# Patient Record
Sex: Female | Born: 1955 | Race: White | Hispanic: No | Marital: Married | Smoking: Former smoker
Health system: Southern US, Community
[De-identification: ages and names within clinical notes are randomized; demographics above are authoritative.]

## PROBLEM LIST (undated history)

## (undated) DIAGNOSIS — T8859XA Other complications of anesthesia, initial encounter: Secondary | ICD-10-CM

## (undated) DIAGNOSIS — L309 Dermatitis, unspecified: Secondary | ICD-10-CM

## (undated) DIAGNOSIS — T4145XA Adverse effect of unspecified anesthetic, initial encounter: Secondary | ICD-10-CM

## (undated) DIAGNOSIS — M1990 Unspecified osteoarthritis, unspecified site: Secondary | ICD-10-CM

## (undated) DIAGNOSIS — F4310 Post-traumatic stress disorder, unspecified: Secondary | ICD-10-CM

## (undated) HISTORY — DX: Post-traumatic stress disorder, unspecified: F43.10

## (undated) HISTORY — DX: Dermatitis, unspecified: L30.9

## (undated) HISTORY — PX: COLONOSCOPY: SHX174

---

## 1977-02-04 HISTORY — PX: PILONIDAL CYST EXCISION: SHX744

## 1998-01-13 ENCOUNTER — Other Ambulatory Visit: Admission: RE | Admit: 1998-01-13 | Discharge: 1998-01-13 | Payer: Self-pay | Admitting: Obstetrics and Gynecology

## 1999-11-16 ENCOUNTER — Encounter: Payer: Self-pay | Admitting: Obstetrics and Gynecology

## 1999-11-16 ENCOUNTER — Encounter: Admission: RE | Admit: 1999-11-16 | Discharge: 1999-11-16 | Payer: Self-pay | Admitting: Obstetrics and Gynecology

## 2001-05-28 ENCOUNTER — Encounter: Payer: Self-pay | Admitting: Obstetrics and Gynecology

## 2001-05-28 ENCOUNTER — Encounter: Admission: RE | Admit: 2001-05-28 | Discharge: 2001-05-28 | Payer: Self-pay | Admitting: Obstetrics and Gynecology

## 2002-02-04 HISTORY — PX: CYST EXCISION: SHX5701

## 2002-05-31 ENCOUNTER — Encounter: Admission: RE | Admit: 2002-05-31 | Discharge: 2002-05-31 | Payer: Self-pay | Admitting: Obstetrics and Gynecology

## 2002-05-31 ENCOUNTER — Encounter: Payer: Self-pay | Admitting: Obstetrics and Gynecology

## 2004-04-16 ENCOUNTER — Ambulatory Visit: Payer: Self-pay | Admitting: Internal Medicine

## 2004-04-23 ENCOUNTER — Ambulatory Visit: Payer: Self-pay | Admitting: Internal Medicine

## 2006-08-04 ENCOUNTER — Ambulatory Visit: Payer: Self-pay | Admitting: Internal Medicine

## 2006-08-04 LAB — CONVERTED CEMR LAB
ALT: 52 units/L — ABNORMAL HIGH (ref 0–35)
AST: 42 units/L — ABNORMAL HIGH (ref 0–37)
Albumin: 3.9 g/dL (ref 3.5–5.2)
Alkaline Phosphatase: 51 units/L (ref 39–117)
BUN: 10 mg/dL (ref 6–23)
Basophils Absolute: 0 10*3/uL (ref 0.0–0.1)
Basophils Relative: 0.5 % (ref 0.0–1.0)
Bilirubin, Direct: 0.1 mg/dL (ref 0.0–0.3)
CO2: 31 meq/L (ref 19–32)
Calcium: 9.7 mg/dL (ref 8.4–10.5)
Chloride: 107 meq/L (ref 96–112)
Cholesterol: 221 mg/dL (ref 0–200)
Creatinine, Ser: 0.9 mg/dL (ref 0.4–1.2)
Direct LDL: 94 mg/dL
Eosinophils Absolute: 0.1 10*3/uL (ref 0.0–0.6)
Eosinophils Relative: 2.6 % (ref 0.0–5.0)
GFR calc Af Amer: 85 mL/min
GFR calc non Af Amer: 70 mL/min
Glucose, Bld: 82 mg/dL (ref 70–99)
HCT: 37.3 % (ref 36.0–46.0)
HDL: 98.4 mg/dL (ref >39.0)
Hemoglobin: 12.9 g/dL (ref 12.0–15.0)
Lymphocytes Relative: 44.1 % (ref 12.0–46.0)
MCHC: 34.7 g/dL (ref 30.0–36.0)
MCV: 97.3 fL (ref 78.0–100.0)
Monocytes Absolute: 0.4 10*3/uL (ref 0.2–0.7)
Monocytes Relative: 11.4 % — ABNORMAL HIGH (ref 3.0–11.0)
Neutro Abs: 1.5 10*3/uL (ref 1.4–7.7)
Neutrophils Relative %: 41.4 % — ABNORMAL LOW (ref 43.0–77.0)
Platelets: 315 10*3/uL (ref 150–400)
Potassium: 4.6 meq/L (ref 3.5–5.1)
RBC: 3.83 M/uL — ABNORMAL LOW (ref 3.87–5.11)
RDW: 12.2 % (ref 11.5–14.6)
Sodium: 142 meq/L (ref 135–145)
TSH: 1.46 microintl units/mL (ref 0.35–5.50)
Total Bilirubin: 0.9 mg/dL (ref 0.3–1.2)
Total CHOL/HDL Ratio: 2.2
Total Protein: 6.8 g/dL (ref 6.0–8.3)
Triglycerides: 62 mg/dL (ref 0–149)
VLDL: 12 mg/dL (ref 0–40)
WBC: 3.7 10*3/uL — ABNORMAL LOW (ref 4.5–10.5)

## 2006-08-20 ENCOUNTER — Ambulatory Visit: Payer: Self-pay | Admitting: Internal Medicine

## 2006-08-20 ENCOUNTER — Encounter: Payer: Self-pay | Admitting: Internal Medicine

## 2006-08-20 DIAGNOSIS — F411 Generalized anxiety disorder: Secondary | ICD-10-CM | POA: Insufficient documentation

## 2006-08-20 DIAGNOSIS — D72818 Other decreased white blood cell count: Secondary | ICD-10-CM | POA: Insufficient documentation

## 2006-08-20 DIAGNOSIS — R945 Abnormal results of liver function studies: Secondary | ICD-10-CM | POA: Insufficient documentation

## 2006-09-29 ENCOUNTER — Ambulatory Visit: Payer: Self-pay | Admitting: Internal Medicine

## 2006-09-29 LAB — CONVERTED CEMR LAB
ANA Titer 1: 1:160 {titer} — ABNORMAL HIGH
Anti Nuclear Antibody(ANA): POSITIVE — AB
HCV Ab: NEGATIVE
Hep B Core Total Ab: NEGATIVE
Hep B S Ab: NEGATIVE
Hepatitis B Surface Ag: NEGATIVE

## 2006-10-07 LAB — CONVERTED CEMR LAB
ALT: 44 units/L — ABNORMAL HIGH (ref 0–35)
AST: 43 units/L — ABNORMAL HIGH (ref 0–37)
Albumin: 4.4 g/dL (ref 3.5–5.2)
Alkaline Phosphatase: 64 units/L (ref 39–117)
Basophils Absolute: 0 10*3/uL (ref 0.0–0.1)
Basophils Relative: 0.1 % (ref 0.0–1.0)
Bilirubin, Direct: 0.2 mg/dL (ref 0.0–0.3)
Eosinophils Absolute: 0.1 10*3/uL (ref 0.0–0.6)
Eosinophils Relative: 1.5 % (ref 0.0–5.0)
Ferritin: 44.5 ng/mL (ref 10.0–291.0)
HCT: 39 % (ref 36.0–46.0)
Hemoglobin: 13.7 g/dL (ref 12.0–15.0)
Lymphocytes Relative: 36.9 % (ref 12.0–46.0)
MCHC: 35.2 g/dL (ref 30.0–36.0)
MCV: 96 fL (ref 78.0–100.0)
Monocytes Absolute: 0.5 10*3/uL (ref 0.2–0.7)
Monocytes Relative: 10.7 % (ref 3.0–11.0)
Neutro Abs: 2.2 10*3/uL (ref 1.4–7.7)
Neutrophils Relative %: 50.8 % (ref 43.0–77.0)
Platelets: 353 10*3/uL (ref 150–400)
RBC: 4.06 M/uL (ref 3.87–5.11)
RDW: 12.3 % (ref 11.5–14.6)
Sed Rate: 15 mm/hr (ref 0–25)
Total Bilirubin: 1.1 mg/dL (ref 0.3–1.2)
Total Protein: 7.1 g/dL (ref 6.0–8.3)
Vitamin B-12: 864 pg/mL (ref 211–911)
WBC: 4.5 10*3/uL (ref 4.5–10.5)

## 2007-02-09 ENCOUNTER — Ambulatory Visit: Payer: Self-pay | Admitting: Internal Medicine

## 2007-02-09 DIAGNOSIS — R740 Nonspecific elevation of levels of transaminase and lactic acid dehydrogenase [LDH]: Secondary | ICD-10-CM

## 2007-02-09 DIAGNOSIS — R7401 Elevation of levels of liver transaminase levels: Secondary | ICD-10-CM | POA: Insufficient documentation

## 2007-02-09 LAB — CONVERTED CEMR LAB
ALT: 29 units/L (ref 0–35)
AST: 34 units/L (ref 0–37)
Albumin: 4 g/dL (ref 3.5–5.2)
Alkaline Phosphatase: 65 units/L (ref 39–117)
Bilirubin, Direct: 0.1 mg/dL (ref 0.0–0.3)
Total Bilirubin: 0.7 mg/dL (ref 0.3–1.2)
Total Protein: 6.8 g/dL (ref 6.0–8.3)

## 2007-02-16 ENCOUNTER — Ambulatory Visit: Payer: Self-pay | Admitting: Internal Medicine

## 2007-02-16 DIAGNOSIS — R799 Abnormal finding of blood chemistry, unspecified: Secondary | ICD-10-CM | POA: Insufficient documentation

## 2007-07-07 ENCOUNTER — Ambulatory Visit: Payer: Self-pay | Admitting: Internal Medicine

## 2007-07-14 ENCOUNTER — Ambulatory Visit: Payer: Self-pay | Admitting: Internal Medicine

## 2007-08-24 ENCOUNTER — Ambulatory Visit: Payer: Self-pay | Admitting: Internal Medicine

## 2007-08-24 LAB — CONVERTED CEMR LAB
ALT: 29 units/L (ref 0–35)
AST: 33 units/L (ref 0–37)
Albumin: 4.1 g/dL (ref 3.5–5.2)
Alkaline Phosphatase: 59 units/L (ref 39–117)
BUN: 11 mg/dL (ref 6–23)
Basophils Absolute: 0 10*3/uL (ref 0.0–0.1)
Basophils Relative: 0.9 % (ref 0.0–3.0)
Bilirubin Urine: NEGATIVE
Bilirubin, Direct: 0.1 mg/dL (ref 0.0–0.3)
Blood in Urine, dipstick: NEGATIVE
CO2: 30 meq/L (ref 19–32)
Calcium: 9.6 mg/dL (ref 8.4–10.5)
Chloride: 109 meq/L (ref 96–112)
Cholesterol: 213 mg/dL (ref 0–200)
Creatinine, Ser: 1.1 mg/dL (ref 0.4–1.2)
Direct LDL: 96.9 mg/dL
Eosinophils Absolute: 0.2 10*3/uL (ref 0.0–0.7)
Eosinophils Relative: 4.5 % (ref 0.0–5.0)
GFR calc Af Amer: 67 mL/min
GFR calc non Af Amer: 55 mL/min
Glucose, Bld: 83 mg/dL (ref 70–99)
Glucose, Urine, Semiquant: NEGATIVE
HCT: 36.4 % (ref 36.0–46.0)
HDL: 94.3 mg/dL (ref >39.0)
Hemoglobin: 12.9 g/dL (ref 12.0–15.0)
Ketones, urine, test strip: NEGATIVE
Lymphocytes Relative: 50.7 % — ABNORMAL HIGH (ref 12.0–46.0)
MCHC: 35.5 g/dL (ref 30.0–36.0)
MCV: 97.3 fL (ref 78.0–100.0)
Monocytes Absolute: 0.4 10*3/uL (ref 0.1–1.0)
Monocytes Relative: 8.9 % (ref 3.0–12.0)
Neutro Abs: 1.5 10*3/uL (ref 1.4–7.7)
Neutrophils Relative %: 35 % — ABNORMAL LOW (ref 43.0–77.0)
Nitrite: NEGATIVE
Platelets: 298 10*3/uL (ref 150–400)
Potassium: 4.6 meq/L (ref 3.5–5.1)
Protein, U semiquant: NEGATIVE
RBC: 3.75 M/uL — ABNORMAL LOW (ref 3.87–5.11)
RDW: 12.7 % (ref 11.5–14.6)
Sodium: 145 meq/L (ref 135–145)
Specific Gravity, Urine: 1.02
TSH: 1.96 microintl units/mL (ref 0.35–5.50)
Total Bilirubin: 1.1 mg/dL (ref 0.3–1.2)
Total CHOL/HDL Ratio: 2.3
Total Protein: 6.9 g/dL (ref 6.0–8.3)
Triglycerides: 59 mg/dL (ref 0–149)
Urobilinogen, UA: 0.2
VLDL: 12 mg/dL (ref 0–40)
WBC Urine, dipstick: NEGATIVE
WBC: 4.3 10*3/uL — ABNORMAL LOW (ref 4.5–10.5)
pH: 5.5

## 2007-08-31 ENCOUNTER — Ambulatory Visit: Payer: Self-pay | Admitting: Internal Medicine

## 2007-08-31 DIAGNOSIS — N951 Menopausal and female climacteric states: Secondary | ICD-10-CM | POA: Insufficient documentation

## 2007-09-28 ENCOUNTER — Encounter: Payer: Self-pay | Admitting: Internal Medicine

## 2007-09-28 ENCOUNTER — Ambulatory Visit: Payer: Self-pay | Admitting: Internal Medicine

## 2008-05-17 ENCOUNTER — Telehealth: Payer: Self-pay | Admitting: Internal Medicine

## 2008-05-18 ENCOUNTER — Ambulatory Visit: Payer: Self-pay | Admitting: Internal Medicine

## 2008-05-18 DIAGNOSIS — J309 Allergic rhinitis, unspecified: Secondary | ICD-10-CM | POA: Insufficient documentation

## 2008-05-18 DIAGNOSIS — J069 Acute upper respiratory infection, unspecified: Secondary | ICD-10-CM | POA: Insufficient documentation

## 2008-05-19 ENCOUNTER — Ambulatory Visit: Payer: Self-pay | Admitting: Internal Medicine

## 2008-05-19 DIAGNOSIS — J029 Acute pharyngitis, unspecified: Secondary | ICD-10-CM | POA: Insufficient documentation

## 2008-05-19 LAB — CONVERTED CEMR LAB: Rapid Strep: NEGATIVE

## 2010-06-22 NOTE — Assessment & Plan Note (Signed)
[facility]                                 ON-CALL NOTE   BECKA, LAGASSE                          MRN:          [id number]  DATE:07/04/2008                            DOB:          14-Jul-1955    The patient of Dr. Fabian Sharp.   The patient calls in with runny nose, nasal congestion, and cough with  slight sore throat for 2 days.  The patient requests azithromycin.  I  have discussed her symptoms with her, which found to be typical upper  respiratory infection symptoms and we discussed symptomatic care  including DayQuil, Mucinex DM or similar medication during the daytime  along with nasal decongestant.  If DayQuil does not work, which includes  phenylephrine, she could try pseudoephedrine.  Also, recommended getting  some Afrin nasal spray over the next 3-4 days.  She can also take NyQuil  at nighttime.  If she has continued symptoms lasting 10-14 days and then  further medical evaluation is warranted, but I reassured her on at the  telephone that this likely is upper respiratory viral infection without  probable bacterial infection.  She does not have a history of pulmonary  disease, asthma, or emphysema.     Juleen China, MD    STC/MedQ  DD: 07/04/2008  DT: 07/04/2008  Job #: [id number]   cc:   Neta Mends. Fabian Sharp, MD

## 2013-09-24 ENCOUNTER — Encounter: Payer: Self-pay | Admitting: Internal Medicine

## 2015-01-04 ENCOUNTER — Emergency Department (HOSPITAL_BASED_OUTPATIENT_CLINIC_OR_DEPARTMENT_OTHER)
Admission: EM | Admit: 2015-01-04 | Discharge: 2015-01-04 | Disposition: A | Discharge status: Home / Self Care | Payer: PRIVATE HEALTH INSURANCE | Attending: Emergency Medicine | Admitting: Emergency Medicine

## 2015-01-04 ENCOUNTER — Emergency Department (HOSPITAL_BASED_OUTPATIENT_CLINIC_OR_DEPARTMENT_OTHER): Payer: PRIVATE HEALTH INSURANCE

## 2015-01-04 ENCOUNTER — Encounter (HOSPITAL_BASED_OUTPATIENT_CLINIC_OR_DEPARTMENT_OTHER): Payer: Self-pay

## 2015-01-04 DIAGNOSIS — J9801 Acute bronchospasm: Secondary | ICD-10-CM

## 2015-01-04 DIAGNOSIS — Z87891 Personal history of nicotine dependence: Secondary | ICD-10-CM | POA: Diagnosis not present

## 2015-01-04 DIAGNOSIS — R05 Cough: Secondary | ICD-10-CM

## 2015-01-04 DIAGNOSIS — R059 Cough, unspecified: Secondary | ICD-10-CM

## 2015-01-04 DIAGNOSIS — R0982 Postnasal drip: Secondary | ICD-10-CM

## 2015-01-04 DIAGNOSIS — J219 Acute bronchiolitis, unspecified: Secondary | ICD-10-CM | POA: Diagnosis not present

## 2015-01-04 MED ORDER — ALBUTEROL SULFATE HFA 108 (90 BASE) MCG/ACT IN AERS: 2.0000 | INHALATION_SPRAY | RESPIRATORY_TRACT | Status: DC | PRN

## 2015-01-04 MED ORDER — ALBUTEROL SULFATE HFA 108 (90 BASE) MCG/ACT IN AERS
2.0000 | INHALATION_SPRAY | RESPIRATORY_TRACT | Status: DC | PRN
  Administered 2015-01-04: 2 via RESPIRATORY_TRACT
  Filled 2015-01-04: qty 6.7

## 2015-01-04 MED ORDER — AEROCHAMBER PLUS FLO-VU MEDIUM MISC
1.0000 | Freq: Once | Status: AC
  Administered 2015-01-04: 1
  Filled 2015-01-04: qty 1

## 2015-01-04 NOTE — Discharge Instructions (Signed)
1. Medications: albuterol, zyrtec, usual home medications 2. Treatment: rest, drink plenty of fluids, take tylenol or ibuprofen for fever control 3. Follow Up: Please followup with your primary doctor in 2 weeks for discussion of your diagnoses and further evaluation after today's visit; if you do not have a primary care doctor use the resource guide provided to find one; Return to the ER for high fevers, difficulty breathing or other concerning symptoms
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
## 2015-01-04 NOTE — ED Notes (Signed)
C/o prod cough x 4 weeks-[facility]-completed abx- "over a week ago"

## 2016-11-01 ENCOUNTER — Ambulatory Visit (INDEPENDENT_AMBULATORY_CARE_PROVIDER_SITE_OTHER): Payer: No Typology Code available for payment source | Admitting: Family Medicine

## 2016-11-01 ENCOUNTER — Encounter: Payer: Self-pay | Admitting: Family Medicine

## 2016-11-01 VITALS — BP 90/70 | HR 64 | Temp 97.9°F | Ht 63.0 in | Wt 134.3 lb

## 2016-11-01 DIAGNOSIS — Z7689 Persons encountering health services in other specified circumstances: Secondary | ICD-10-CM | POA: Diagnosis not present

## 2016-11-01 DIAGNOSIS — Z23 Encounter for immunization: Secondary | ICD-10-CM | POA: Diagnosis not present

## 2016-11-01 DIAGNOSIS — F32A Depression, unspecified: Secondary | ICD-10-CM

## 2016-11-01 DIAGNOSIS — M25551 Pain in right hip: Secondary | ICD-10-CM | POA: Diagnosis not present

## 2016-11-01 DIAGNOSIS — F419 Anxiety disorder, unspecified: Secondary | ICD-10-CM | POA: Diagnosis not present

## 2016-11-01 DIAGNOSIS — F329 Major depressive disorder, single episode, unspecified: Secondary | ICD-10-CM | POA: Diagnosis not present

## 2016-11-01 NOTE — Progress Notes (Signed)
HPI:  Ashley Williams is here to establish care. Was seeing a primary care doctor in [facility], but her insurance was not accepted there. Her husband sees Dr. Sarajane Jews and saw me once and recommended me to her. She is fairly healthy overall. She has a past medical history of anxiety and depression and used to be on medications for this. She currently treats this with a mood supplement that contains amino acids and magnesium and is doing much better. She has some chronic right lateral hip pain that she thinks was caused by a fall remotely in 2002. She was running and tripped on a lemon and fell on her right knee and left shoulder. The pain is worsened by long days of being on her feet. There are many days when she has no pain at all, and she has no symptoms today. She is interested in osteopathic treatments for this.it sounds like she had a physical yearly with her prior PCP and had a physical last year. She is due for mammogram and is going to check with her insurance about where she should have this done. ROS negative for unless reported above: fevers, unintentional weight loss, hearing or vision loss, chest pain, palpitations, struggling to breath, hemoptysis, melena, hematochezia, hematuria, falls, loc, si, thoughts of self harm  No past medical history on file.  Past Surgical History:  Procedure Laterality Date  . CYST EXCISION     groin area  . PILONIDAL CYST EXCISION  1979    Family History  Problem Relation Age of Onset  . Cancer Mother   . Colon cancer Mother   . Uterine cancer Mother   . Cancer Father   . Cancer Maternal Grandmother   . Emphysema Maternal Grandfather   . Cancer Paternal Grandfather     Social History   Social History  . Marital status: Married    Spouse name: N/A  . Number of children: N/A  . Years of education: N/A   Social History Main Topics  . Smoking status: Former Research scientist (life sciences)  . Smokeless tobacco: Never Used  . Alcohol use Yes     Comment: occ  . Drug  use: No  . Sexual activity: Yes    Birth control/ protection: Post-menopausal   Other Topics Concern  . None   Social History Narrative   Work or Geographical information systems officer, does Dentist, private practice      Home Situation:lives with husband      Spiritual Beliefs:Christian      Lifestyle:yoga and weight lifting on a regular basis, healthy diet        Current Outpatient Prescriptions:  .  B Complex Vitamins (VITAMIN B COMPLEX PO), Take by mouth., Disp: , Rfl:  .  Flaxseed, Linseed, (FLAX SEED OIL PO), Take by mouth., Disp: , Rfl:  .  Multiple Vitamins-Minerals (MULTIVITAMIN ADULTS PO), Take by mouth., Disp: , Rfl:  .  Omega-3 Fatty Acids (FISH OIL PO), Take by mouth., Disp: , Rfl:  .  OVER THE COUNTER MEDICATION, Essential mood supplement-hormones,etc, Disp: , Rfl:   EXAM:  Vitals:   11/01/16 1114  BP: 90/70  Pulse: 64  Temp: 97.9 F (36.6 C)    Body mass index is 23.79 kg/m.  GENERAL: vitals reviewed and listed above, alert, oriented, appears well hydrated and in no acute distress  HEENT: atraumatic, conjunttiva clear, no obvious abnormalities on inspection of external nose and ears  NECK: no obvious masses on inspection  LUNGS: clear to auscultation bilaterally, no wheezes,  rales or rhonchi, good air movement  CV: HRRR, no peripheral edema  MS: moves all extremities without noticeable abnormality, normal gait, no tenderness to palpation on exam today, normal functioning of the lower extremities bilaterally  PSYCH: pleasant and cooperative, no obvious depression or anxiety  ASSESSMENT AND PLAN:  Discussed the following assessment and plan:  Need for immunization against influenza - Plan: Flu Vaccine QUAD 6+ mos PF IM (Fluarix Quad PF)  Anxiety and depression  Right hip pain - -Tripped on a lemon remotely and landed on right and left shoulder-Intermittent lateral right hip pain worsened by being on her feet a lot  Encounter to establish care -We  reviewed the PMH, PSH, FH, SH, Meds and Allergies. 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  Patient Instructions  BEFORE YOU LEAVE: -flu shot -follow up: 1) OMT session for R hip pain 2) CPE at her convenience   It was very nice to meet you today!     Colin Benton R.

## 2016-11-01 NOTE — Patient Instructions (Signed)
BEFORE YOU LEAVE: -flu shot -follow up: 1) OMT session for R hip pain 2) CPE at her convenience   It was very nice to meet you today!

## 2016-11-21 ENCOUNTER — Encounter: Payer: Self-pay | Admitting: Family Medicine

## 2016-11-21 ENCOUNTER — Ambulatory Visit (INDEPENDENT_AMBULATORY_CARE_PROVIDER_SITE_OTHER): Payer: No Typology Code available for payment source | Admitting: Family Medicine

## 2016-11-21 VITALS — BP 120/80 | HR 67 | Temp 98.0°F | Ht 63.0 in | Wt 136.5 lb

## 2016-11-21 DIAGNOSIS — M25512 Pain in left shoulder: Secondary | ICD-10-CM

## 2016-11-21 DIAGNOSIS — M9901 Segmental and somatic dysfunction of cervical region: Secondary | ICD-10-CM | POA: Diagnosis not present

## 2016-11-21 DIAGNOSIS — M25551 Pain in right hip: Secondary | ICD-10-CM

## 2016-11-21 DIAGNOSIS — M9905 Segmental and somatic dysfunction of pelvic region: Secondary | ICD-10-CM

## 2016-11-21 DIAGNOSIS — G8929 Other chronic pain: Secondary | ICD-10-CM

## 2016-11-21 DIAGNOSIS — M9902 Segmental and somatic dysfunction of thoracic region: Secondary | ICD-10-CM | POA: Diagnosis not present

## 2016-11-21 DIAGNOSIS — M9903 Segmental and somatic dysfunction of lumbar region: Secondary | ICD-10-CM

## 2016-11-21 DIAGNOSIS — M9909 Segmental and somatic dysfunction of abdomen and other regions: Secondary | ICD-10-CM | POA: Diagnosis not present

## 2016-11-21 DIAGNOSIS — M9906 Segmental and somatic dysfunction of lower extremity: Secondary | ICD-10-CM

## 2016-11-21 NOTE — Patient Instructions (Signed)
BEFORE YOU LEAVE: -follow up: 2 weeks omt -xray sheet

## 2016-11-21 NOTE — Progress Notes (Signed)
HPI:  KORRI ASK is a pleasant 61 year old here for osteopathic treatments for chronic right lateral hip pain. She reported at her last visit"She has some chronic right lateral hip pain that she thinks was caused by a fall remotely in 2002. She was running and tripped on a lemon and fell on her right knee and left shoulder. The pain is worsened by long days of being on her feet." She gest pretty severe pain in the R groin at times. The shoulder pain is in the shoulder blade region and the shoulder joint. Sleeping on L worsens shoulder pain. Biofreeze and massage therapy help her pain.  ROS: See pertinent positives and negatives per HPI.  No past medical history on file.  Past Surgical History:  Procedure Laterality Date  . CYST EXCISION     groin area  . PILONIDAL CYST EXCISION  1979    Family History  Problem Relation Age of Onset  . Cancer Mother   . Colon cancer Mother   . Uterine cancer Mother   . Cancer Father   . Cancer Maternal Grandmother   . Emphysema Maternal Grandfather   . Cancer Paternal Grandfather     Social History   Social History  . Marital status: Married    Spouse name: N/A  . Number of children: N/A  . Years of education: N/A   Social History Main Topics  . Smoking status: Former Research scientist (life sciences)  . Smokeless tobacco: Never Used  . Alcohol use Yes     Comment: occ  . Drug use: No  . Sexual activity: Yes    Birth control/ protection: Post-menopausal   Other Topics Concern  . None   Social History Narrative   Work or Geographical information systems officer, does Dentist, private practice      Home Situation:lives with husband      Spiritual Beliefs:Christian      Lifestyle:yoga and weight lifting on a regular basis, healthy diet        Current Outpatient Prescriptions:  .  B Complex Vitamins (VITAMIN B COMPLEX PO), Take by mouth., Disp: , Rfl:  .  Flaxseed, Linseed, (FLAX SEED OIL PO), Take by mouth., Disp: , Rfl:  .  Multiple Vitamins-Minerals  (MULTIVITAMIN ADULTS PO), Take by mouth., Disp: , Rfl:  .  Omega-3 Fatty Acids (FISH OIL PO), Take by mouth., Disp: , Rfl:  .  OVER THE COUNTER MEDICATION, Essential mood supplement-hormones,etc, Disp: , Rfl:   EXAM:  Vitals:   11/21/16 1649  BP: 120/80  Pulse: 67  Temp: 98 F (36.7 C)    Body mass index is 24.18 kg/m.  GENERAL: vitals reviewed and listed above, alert, oriented, appears well hydrated and in no acute distress  HEENT: atraumatic, conjunttiva clear, no obvious abnormalities on inspection of external nose and ears  NECK: no obvious masses on inspection  LUNGS: clear to auscultation bilaterally, no wheezes, rales or rhonchi, good air movement  CV: HRRR, no peripheral edema  MS: moves all extremities without noticeable abnormality, on standing inspection she has pes planus of the R foot, head forward 1-2 cm and L shoulder elevated, OA rotated L, T 2-8 rotated L,L shoulder restricted in abd,  L 2-5 R r, L leg shorter, L ant innominate, R tib fib restricted in ext rotation, R hip with significant restriction and hard endpoint in ext rotation and abd, neg spurling, neg vertebral art insuff testing,   PSYCH: pleasant and cooperative, no obvious depression or anxiety  ASSESSMENT AND PLAN:  Discussed the  following assessment and plan:  Right hip pain - Plan: DG HIP UNILAT WITH PELVIS MIN 4 VIEWS RIGHT  Chronic left shoulder pain  Somatic dysfunction of cervical region  Somatic dysfunction of thoracic region  Somatic dysfunction of lumbar region  Somatic dysfunction of pelvis region  Somatic dysfuncton of other region  Somatic dysfunction of lower extremity  -will get plain films R hip - suspect bony pathology - sig OA? -she opted for osteopathic treatments today and we did very gentle techniques - see below -Patient advised to return or notify a doctor immediately if symptoms worsen or persist or new concerns arise. PROCEDURE NOTE : OSTEOPATHIC  TREATMENT The decision today to treat with gentle Osteopathic Manipulative Therapy  (OMT) was based on physical exam findings. Verbal consent was  obtained after after explanation of risks and benefits. No Cervical HVLA manipulation was performed. After consent was obtained, treatment was  performed as below:      Regions treated: neck, thoracic, lumbar, shoulder, pelvic, lower ext     Techniques used: ME, MR, counterstrain The patient tolerated the treatment well and reported Improved  symptoms following treatment today. Follow up treatment was advised in: 1-2 weeks   Patient Instructions  BEFORE YOU LEAVE: -follow up: 2 weeks omt -xray sheet    Colin Benton R., DO

## 2016-11-22 ENCOUNTER — Other Ambulatory Visit: Payer: Self-pay | Admitting: Family Medicine

## 2016-11-22 DIAGNOSIS — Z1231 Encounter for screening mammogram for malignant neoplasm of breast: Secondary | ICD-10-CM

## 2016-11-27 ENCOUNTER — Ambulatory Visit (INDEPENDENT_AMBULATORY_CARE_PROVIDER_SITE_OTHER)
Admission: RE | Admit: 2016-11-27 | Discharge: 2016-11-27 | Disposition: A | Discharge status: Home / Self Care | Payer: No Typology Code available for payment source | Source: Ambulatory Visit | Attending: Family Medicine | Admitting: Family Medicine

## 2016-11-27 ENCOUNTER — Other Ambulatory Visit: Payer: Self-pay | Admitting: Family Medicine

## 2016-11-27 DIAGNOSIS — M25551 Pain in right hip: Secondary | ICD-10-CM

## 2016-11-28 ENCOUNTER — Other Ambulatory Visit: Payer: Self-pay | Admitting: *Deleted

## 2016-11-28 DIAGNOSIS — M8700 Idiopathic aseptic necrosis of unspecified bone: Secondary | ICD-10-CM

## 2016-11-28 DIAGNOSIS — M160 Bilateral primary osteoarthritis of hip: Secondary | ICD-10-CM

## 2016-12-02 ENCOUNTER — Telehealth: Payer: Self-pay | Admitting: Family Medicine

## 2016-12-02 NOTE — Telephone Encounter (Signed)
Patient called back and I informed her of the message below. 

## 2016-12-02 NOTE — Telephone Encounter (Signed)
Pt states Dr Wynelle Link cannot see her until Jan.  Pt wants to know if ok to see dr Alvan Dame sooner. She had first wanted to see Dr Wynelle Link, but wants to know if you think urgent enough to proceed with Dr Alvan Dame sooner?  Also, pt wants to know if ok for he to exercise with weight machine and yoga? Pt states she read if no blood flow to the hip , it could shatter.  Please advise

## 2016-12-02 NOTE — Telephone Encounter (Signed)
I would advise she see another provider if they have a sooner appt and would advise avoiding strenuous activities involving the hip or difficult yoga positions that involve the hips - especially pigeon pose and those types of poses until advised by ortho. Thanks.

## 2016-12-02 NOTE — Telephone Encounter (Signed)
I left a message for the pt to return my call. 

## 2016-12-04 ENCOUNTER — Ambulatory Visit (INDEPENDENT_AMBULATORY_CARE_PROVIDER_SITE_OTHER): Payer: No Typology Code available for payment source

## 2016-12-04 DIAGNOSIS — Z1231 Encounter for screening mammogram for malignant neoplasm of breast: Secondary | ICD-10-CM | POA: Diagnosis not present

## 2016-12-08 NOTE — Progress Notes (Signed)
HPI:  Follow up multiple musculoskeletal pain complaints. She wishes to treat with OMT. At her initial OMT visit 11/27/16 she had decreased ROM hips with hard endpoint and described chronic often severe hip pain R>L. We didn't treat these areas and I got plain films due to concerns for significant OA - she had severe OA and possible avascular necrosis and I placed a referral to Ortho. She also had pain between the shoulder blades and the shoulders. Sig exam findings other then mentioned above included pes planus R, head forward posture, L shoulder elevated, somatic dysfunction of the cervical, thoracic and lumbar spine, L ant innominate, R tib-fib int rotated. Today reports: doing well. Wants to do further osteopathic treatments. Is set up to see ortho in 3 days regarding her hips. Pain today remains in hips per prior. Some L shoulder restriction/tightness in abduction. Does a lot of yoga. Wants my thoughts on the hip findings. Also wants copy films to take to ortho. Denies: weakness, numbness, fevers, malaise. She also requests information regarding CBT for mild depression. Wants to try our therapist. No severe symptoms and does nor feel needs medications.  ROS: See pertinent positives and negatives per HPI.  History reviewed. No pertinent past medical history.  Past Surgical History:  Procedure Laterality Date  . CYST EXCISION     groin area  . PILONIDAL CYST EXCISION  1979    Family History  Problem Relation Age of Onset  . Cancer Mother   . Colon cancer Mother   . Uterine cancer Mother   . Cancer Father   . Cancer Maternal Grandmother   . Emphysema Maternal Grandfather   . Cancer Paternal Grandfather     Social History   Socioeconomic History  . Marital status: Married    Spouse name: None  . Number of children: None  . Years of education: None  . Highest education level: None  Social Needs  . Financial resource strain: None  . Food insecurity - worry: None  . Food  insecurity - inability: None  . Transportation needs - medical: None  . Transportation needs - non-medical: None  Occupational History  . None  Tobacco Use  . Smoking status: Former Research scientist (life sciences)  . Smokeless tobacco: Never Used  Substance and Sexual Activity  . Alcohol use: Yes    Comment: occ  . Drug use: No  . Sexual activity: Yes    Birth control/protection: Post-menopausal  Other Topics Concern  . None  Social History Narrative   Work or Geographical information systems officer, does Engineer, manufacturing counseling, private practice      Home Situation:lives with husband      Spiritual Beliefs:Christian      Lifestyle:yoga and weight lifting on a regular basis, healthy diet     Current Outpatient Medications:  .  B Complex Vitamins (VITAMIN B COMPLEX PO), Take by mouth., Disp: , Rfl:  .  Flaxseed, Linseed, (FLAX SEED OIL PO), Take by mouth., Disp: , Rfl:  .  Glucosamine-Chondroitin (OSTEO BI-FLEX REGULAR STRENGTH PO), Take daily by mouth., Disp: , Rfl:  .  Multiple Vitamins-Minerals (MULTIVITAMIN ADULTS PO), Take by mouth., Disp: , Rfl:  .  Omega-3 Fatty Acids (FISH OIL PO), Take by mouth., Disp: , Rfl:  .  OVER THE COUNTER MEDICATION, Essential mood supplement-hormones,etc, Disp: , Rfl:   EXAM:  Vitals:   12/09/16 1307  BP: 118/68  Pulse: 67  Temp: 98.1 F (36.7 C)    Body mass index is 24.18 kg/m.  GENERAL: vitals reviewed and listed  above, alert, oriented, appears well hydrated and in no acute distress  NECK: no obvious masses on inspection  LUNGS: clear to auscultation bilaterally, no wheezes, rales or rhonchi, good air movement  CV: HRRR, no peripheral edema  JK:DTOIZT gait. We did not do further exam of hips as will hold off on osteopathic treatments here until seen by ortho. Will focus on back/shoulders/neck today: OA Rr, L SBS torsion, subocipital muscle tension, TI Rr, R 4-8 RrSl, L 3-5 RLSr, L shoulder restricted in Abd and internally rotated  PSYCH: pleasant and cooperative, no  obvious depression or anxiety  ASSESSMENT AND PLAN:  Discussed the following assessment and plan:  Pain of right hip joint  Osteoarthritis of both hips, unspecified osteoarthritis type  Chronic left shoulder pain  Somatic dysfunction of head region  Somatic dysfunction of cervical region  Somatic dysfunction of thoracic region  Somatic dysfunction of spine, lumbar  Somatic dysfunction of upper extremity  Depression, recurrent ([place])  -advised to avoid yoga until seen by ortho - particularly positions that strain the hips/pigeon/etc -copy xray report provided, will have assistant help her with obtaining CD -she wanted to do OMT for other regions, see below, responded well -follow up after ortho eval -assistant to provide CBT (therapy brochure) for her to schedule for mild depression  PROCEDURE NOTE : OSTEOPATHIC TREATMENT The decision today to treat with gentle Osteopathic Manipulative Therapy  (OMT) was based on physical exam findings. Verbal consent was  obtained after after explanation of risks and benefits. No Cervical HVLA manipulation was performed. After consent was obtained, treatment was  performed as below:      Regions treated: head, cervical, thoracic, lumbar, extremity     Techniques used: MR, ME, Cranial The patient tolerated the treatment well and reported Improved  symptoms following treatment today. Follow up treatment was advised in: 1-2 weeks   -Patient advised to return or notify a doctor immediately if symptoms worsen or persist or new concerns arise.  Patient Instructions  BEFORE YOU LEAVE: -print out her xray report for her and instruct her on how to obtain CD copy xrays -follow up: 1-2 weeks for OMT if she wishes  See the orthopedic specialist about your his as planned. Let me know how this goes.      Colin Benton R., DO

## 2016-12-09 ENCOUNTER — Ambulatory Visit: Payer: No Typology Code available for payment source | Admitting: Family Medicine

## 2016-12-09 ENCOUNTER — Encounter: Payer: Self-pay | Admitting: Family Medicine

## 2016-12-09 VITALS — BP 118/68 | HR 67 | Temp 98.1°F | Ht 63.0 in

## 2016-12-09 DIAGNOSIS — M160 Bilateral primary osteoarthritis of hip: Secondary | ICD-10-CM | POA: Diagnosis not present

## 2016-12-09 DIAGNOSIS — M9902 Segmental and somatic dysfunction of thoracic region: Secondary | ICD-10-CM

## 2016-12-09 DIAGNOSIS — M9907 Segmental and somatic dysfunction of upper extremity: Secondary | ICD-10-CM

## 2016-12-09 DIAGNOSIS — M25551 Pain in right hip: Secondary | ICD-10-CM | POA: Diagnosis not present

## 2016-12-09 DIAGNOSIS — M9900 Segmental and somatic dysfunction of head region: Secondary | ICD-10-CM | POA: Diagnosis not present

## 2016-12-09 DIAGNOSIS — G8929 Other chronic pain: Secondary | ICD-10-CM | POA: Diagnosis not present

## 2016-12-09 DIAGNOSIS — M9903 Segmental and somatic dysfunction of lumbar region: Secondary | ICD-10-CM | POA: Diagnosis not present

## 2016-12-09 DIAGNOSIS — M25512 Pain in left shoulder: Secondary | ICD-10-CM | POA: Diagnosis not present

## 2016-12-09 DIAGNOSIS — M9901 Segmental and somatic dysfunction of cervical region: Secondary | ICD-10-CM

## 2016-12-09 DIAGNOSIS — F339 Major depressive disorder, recurrent, unspecified: Secondary | ICD-10-CM | POA: Insufficient documentation

## 2016-12-09 NOTE — Patient Instructions (Signed)
BEFORE YOU LEAVE: -print out her xray report for her and instruct her on how to obtain CD copy xrays -follow up: 1-2 weeks for OMT if she wishes  See the orthopedic specialist about your his as planned. Let me know how this goes.

## 2017-02-12 ENCOUNTER — Telehealth: Payer: Self-pay | Admitting: Family Medicine

## 2017-02-12 NOTE — Telephone Encounter (Signed)
Patient dropped off surgical clearance form  Fax form to: [phone] ATTN: Orson Slick  Disposition: Dr's Folder

## 2017-02-13 NOTE — Telephone Encounter (Signed)
Can you schedule preop? Don't need to rout this to me. Just schedule and hold form for appt. Thanks.

## 2017-02-13 NOTE — Telephone Encounter (Signed)
I left a detailed message stating per Dr Maudie Mercury the pt needs to schedule an appt prior to the clearance letter being completed.  I asked that the pt call back for an appt.

## 2017-02-14 ENCOUNTER — Encounter: Payer: Self-pay | Admitting: *Deleted

## 2017-02-14 ENCOUNTER — Ambulatory Visit (INDEPENDENT_AMBULATORY_CARE_PROVIDER_SITE_OTHER): Payer: BLUE CROSS/BLUE SHIELD | Admitting: Family Medicine

## 2017-02-14 ENCOUNTER — Encounter: Payer: Self-pay | Admitting: Family Medicine

## 2017-02-14 VITALS — BP 100/70 | HR 59 | Temp 97.6°F | Ht 63.0 in | Wt 136.6 lb

## 2017-02-14 DIAGNOSIS — M25559 Pain in unspecified hip: Secondary | ICD-10-CM

## 2017-02-14 DIAGNOSIS — Z01818 Encounter for other preprocedural examination: Secondary | ICD-10-CM

## 2017-02-14 NOTE — Patient Instructions (Signed)
We will fax a summary and the form to your surgeons office.

## 2017-02-14 NOTE — Progress Notes (Signed)
No chief complaint on file.   HPI:  Patient is seen for optimization of general medical care prior to surgery. Surgery type: R hip THA w/wo autograft/allograft Date of surgery: 03/11/2017  Kidney disease? No Prior surgeries/Issues following anesthesia? Pilonidal cyst removal - had sedation, remote, no issues or complication Hx MI, heart arrythmia, CHF, angina or stroke? none Epilepsy or Seizures? none Arthritis or problems with neck or jaw? none Thyroid disease? none Liver disease? none Asthma, COPD or chronic lung disease? none Diabetes? none (Needs to be evaluated by anesthesia if yes to these questions.)  Other: Poor nutrition, Frail or other: no  METS:  ?Can take care of self, such as eat, dress, or use the toilet (1 MET). yes ?Can walk up a flight of steps or a hill (4 METs).yes ?Can do heavy work around the house such as scrubbing floors or lifting or moving heavy furniture (between 4 and 10 METs). yes ?Can participate in strenuous sports such as swimming, singles tennis, football, basketball, and skiing (>10 METs): swimming ok - can swim right now . AHA Risks: Major predictors that require intensive management and may lead to delay in or cancellation of the operative procedure unless emergent: NONE  . Unstable coronary syndromes including unstable or severe angina or recent MI  . Decompensated heart failure including NYHA functional class IV or worsening or new-onset HF  . Significant arrhythmias including high grade AV block, symptomatic ventricular arrhythmias, supraventricular arrhythmias with ventricular rate >100 bpm at rest, symptomatic bradycardia, and newly recognized ventricular tachycardia  . Severe heart valve disease including severe aortic stenosis or symptomatic mitral stenosis   Other clinical predictors that warrant careful assessment of current status: NONE  . History of ischemic heart disease . History of cerebrovascular disease  . History of compensated  heart failure or prior heart failure  . Diabetes mellitus  . Renal insufficiency  Type of surgery and Risk: 1) High risk (reported risk of cardiac death or nonfatal myocardial infarction [MI] often greater than 5 percent):  Marland Kitchen Aortic and other major vascular surgery  . Peripheral artery surgery   2)Intermediate risk (reported risk of cardiac death or nonfatal MI generally 1 to 5 percent):  Marland Kitchen Carotid endarterectomy  . Head and neck surgery  . Intraperitoneal and intrathoracic surgery  . Orthopedic surgery  . Prostate surgery   3)Low risk (reported risk of cardiac death or nonfatal MI generally less than 1 percent):  Marland Kitchen Ambulatory surgery  . Endoscopic procedures  . Superficial procedure  . Cataract surgery  . Breast surgery  Medications that need to be addressed prior to surgery: supplements Discontinue acei/arbs/non-statin lipid lowering drugs day of surgery ASA stop 7 days before or discuss with cardiology if CV risks, other anticoagulants discuss with cardiology.   ROS: See pertinent positives and negatives per HPI. 11 point ROS negative except where noted.  History reviewed. No pertinent past medical history.  Past Surgical History:  Procedure Laterality Date  . CYST EXCISION     groin area  . PILONIDAL CYST EXCISION  1979    Family History  Problem Relation Age of Onset  . Cancer Mother   . Colon cancer Mother   . Uterine cancer Mother   . Cancer Father   . Cancer Maternal Grandmother   . Emphysema Maternal Grandfather   . Cancer Paternal Grandfather     Social History   Socioeconomic History  . Marital status: Married    Spouse name: None  . Number of children: None  .  Years of education: None  . Highest education level: None  Social Needs  . Financial resource strain: None  . Food insecurity - worry: None  . Food insecurity - inability: None  . Transportation needs - medical: None  . Transportation needs - non-medical: None  Occupational History  .  None  Tobacco Use  . Smoking status: Former Research scientist (life sciences)  . Smokeless tobacco: Never Used  Substance and Sexual Activity  . Alcohol use: Yes    Comment: occ  . Drug use: No  . Sexual activity: Yes    Birth control/protection: Post-menopausal  Other Topics Concern  . None  Social History Narrative   Work or Geographical information systems officer, does Engineer, manufacturing counseling, private practice      Home Situation:lives with husband      Spiritual Beliefs:Christian      Lifestyle:yoga and weight lifting on a regular basis, healthy diet     Current Outpatient Medications:  .  B Complex Vitamins (VITAMIN B COMPLEX PO), Take by mouth., Disp: , Rfl:  .  Flaxseed, Linseed, (FLAX SEED OIL PO), Take by mouth., Disp: , Rfl:  .  Glucosamine-Chondroitin (OSTEO BI-FLEX REGULAR STRENGTH PO), Take daily by mouth., Disp: , Rfl:  .  Multiple Vitamins-Minerals (MULTIVITAMIN ADULTS PO), Take by mouth., Disp: , Rfl:  .  Omega-3 Fatty Acids (FISH OIL PO), Take by mouth., Disp: , Rfl:  .  OVER THE COUNTER MEDICATION, Essential mood supplement-hormones,etc, Disp: , Rfl:   EXAM:  Vitals:   02/14/17 1138  BP: 100/70  Pulse: (!) 59  Temp: 97.6 F (36.4 C)    Body mass index is 24.2 kg/m.  GENERAL: vitals reviewed and listed above, alert, oriented, appears well hydrated and in no acute distress  HEENT: atraumatic, conjunttiva clear, no obvious abnormalities on inspection of external nose and ears  NECK: no obvious masses on inspection, no carotid bruits  LUNGS: clear to auscultation bilaterally, no wheezes, rales or rhonchi, good air movement  CV: HRRR, no peripheral edema, no JVD, BP normal range, normal radial pulses  MS: moves all extremities without noticeable abnormality  PSYCH: pleasant and cooperative, no obvious depression or anxiety  More than 50% of over 25 minutes spent in total in caring for this patient was spent face-to-face with the patient, counseling and/or coordinating care.    ASSESSMENT AND  PLAN:  Discussed the following assessment and plan:  Pre-op examination  Arthralgia of hip, unspecified laterality  Assessment: -Risk factors: none -Surgery Risks:intermediate -age, nutritional status, fraility: good nutritional status, age <65, no fraility -functional capacity: > 4 - 6 METs wethout symptoms -comorbidities: none Patient Specific Risks: patient is low risk for intermediate risks surgery  Recommendations for optimizing general medical care prior to surgery: -would advise basic CBC and BMP shortly prior to surgery and any other testing anesthesia or surgeon feel is warranted - will defer to surgical office for this -advised patient to discuss specific risks morbidity and mortality of surgery with surgeon, CV risks discussed with patient -advised patient will defer to surgeon for post-op DVT prophylaxis and post op care -no specific medical recommendations except good nutrition, activity as tolerated and per surgeon recommendations and stop supplements 1 week prior to surgery  - at this time and no recommendations to defer surgery or for further CV testing prior to surgery -this assessment/plan and  form for pre-op optimization of general medical care prior to surgery faxed to surgeon office per patient request   Patient Instructions  We will fax a summary and  the form to your surgeons office.       Colin Benton R.

## 2017-03-02 NOTE — H&P (Signed)
TOTAL HIP ADMISSION H&P  Patient is admitted for right total hip arthroplasty, anterior appraoch.  Subjective:  Chief Complaint:     Right hip primary OA / pain  HPI: Ashley Williams, 62 y.o. female, has a history of pain and functional disability in the right hip(s) due to arthritis and patient has failed non-surgical conservative treatments for greater than 12 weeks to include NSAID's and/or analgesics and activity modification.  Onset of symptoms was gradual starting 4+ years ago with gradually worsening course since that time.The patient noted no past surgery on the right hip(s).  Patient currently rates pain in the right hip at 9 out of 10 with activity. Patient has night pain, worsening of pain with activity and weight bearing, trendelenberg gait, pain that interfers with activities of daily living and pain with passive range of motion. Patient has evidence of periarticular osteophytes and joint space narrowing by imaging studies. This condition presents safety issues increasing the risk of falls.  There is no current active infection.  Risks, benefits and expectations were discussed with the patient.  Risks including but not limited to the risk of anesthesia, blood clots, nerve damage, blood vessel damage, failure of the prosthesis, infection and up to and including death.  Patient understand the risks, benefits and expectations and wishes to proceed with surgery.   PCP: Lucretia Kern, DO  D/C Plans:       Home   Post-op Meds:       No Rx given  Tranexamic Acid:      To be given - IV   Decadron:      Is to be given  FYI:      ASA  DME:   Rx given for - RW   PT:   No PT    Patient Active Problem List   Diagnosis Date Noted  . Depression, recurrent ([place]) 12/09/2016  . Osteoarthritis of both hips 12/09/2016  . Chronic left shoulder pain 12/09/2016   No past medical history on file.  Past Surgical History:  Procedure Laterality Date  . CYST EXCISION     groin area  . PILONIDAL  CYST EXCISION  1979    No current facility-administered medications for this encounter.    Current Outpatient Medications  Medication Sig Dispense Refill Last Dose  . Flaxseed, Linseed, (FLAXSEED OIL) 1200 MG CAPS Take 1,200 mg by mouth daily.     . Glucosamine-Chondroitin (OSTEO BI-FLEX REGULAR STRENGTH PO) Take 1 tablet by mouth daily.    Taking  . ibuprofen (ADVIL,MOTRIN) 200 MG tablet Take 400 mg by mouth every 8 (eight) hours as needed (for pain.).     Marland Kitchen MELATON-5 HTP-TRYPTOPHAN-B6-MG PO Take 1 tablet by mouth every other day. Essential Sleep Dietary Supplement at bedtime.     . Multiple Vitamin (MULTIVITAMIN WITH MINERALS) TABS tablet Take 1 tablet by mouth daily. DAILY ESSENTIALS     . Omega-3 Fatty Acids (FISH OIL) 1200 MG CAPS Take 1,200 mg by mouth daily.     Marland Kitchen OVER THE COUNTER MEDICATION Take 2 capsules by mouth 2 (two) times daily. MOOD ESSENTIALS.   Taking   No Known Allergies   Social History   Tobacco Use  . Smoking status: Former Research scientist (life sciences)  . Smokeless tobacco: Never Used  Substance Use Topics  . Alcohol use: Yes    Comment: occ    Family History  Problem Relation Age of Onset  . Cancer Mother   . Colon cancer Mother   . Uterine cancer  Mother   . Cancer Father   . Cancer Maternal Grandmother   . Emphysema Maternal Grandfather   . Cancer Paternal Grandfather      Review of Systems  Constitutional: Negative.   HENT: Negative.   Eyes: Negative.   Respiratory: Negative.   Cardiovascular: Negative.   Gastrointestinal: Negative.   Genitourinary: Negative.   Musculoskeletal: Positive for joint pain.  Skin: Negative.   Neurological: Negative.   Endo/Heme/Allergies: Negative.   Psychiatric/Behavioral: Positive for depression.    Objective:  Physical Exam  Constitutional: She is oriented to person, place, and time. She appears well-developed.  HENT:  Head: Normocephalic.  Eyes: Pupils are equal, round, and reactive to light.  Neck: Neck supple. No JVD  present. No tracheal deviation present. No thyromegaly present.  Cardiovascular: Normal rate, regular rhythm and intact distal pulses.  Respiratory: Effort normal and breath sounds normal. No respiratory distress. She has no wheezes.  GI: Soft. There is no tenderness. There is no guarding.  Musculoskeletal:       Right hip: She exhibits decreased range of motion, decreased strength, tenderness and bony tenderness. She exhibits no swelling, no deformity and no laceration.  Lymphadenopathy:    She has no cervical adenopathy.  Neurological: She is alert and oriented to person, place, and time.  Skin: Skin is warm and dry.  Psychiatric: She has a normal mood and affect.     Labs:  Estimated body mass index is 24.2 kg/m as calculated from the following:   Height as of 02/14/17: 5\' 3"  (1.6 m).   Weight as of 02/14/17: 62 kg (136 lb 9.6 oz).   Imaging Review Plain radiographs demonstrate severe degenerative joint disease of the right hip(s). The bone quality appears to be good for age and reported activity level.  Assessment/Plan:  End stage arthritis, right hip(s)  The patient history, physical examination, clinical judgement of the provider and imaging studies are consistent with end stage degenerative joint disease of the right hip(s) and total hip arthroplasty is deemed medically necessary. The treatment options including medical management, injection therapy, arthroscopy and arthroplasty were discussed at length. The risks and benefits of total hip arthroplasty were presented and reviewed. The risks due to aseptic loosening, infection, stiffness, dislocation/subluxation,  thromboembolic complications and other imponderables were discussed.  The patient acknowledged the explanation, agreed to proceed with the plan and consent was signed. Patient is being admitted for inpatient treatment for surgery, pain control, PT, OT, prophylactic antibiotics, VTE prophylaxis, progressive ambulation and  ADL's and discharge planning.The patient is planning to be discharged home.      West Pugh Cai Anfinson   PA-C  03/02/2017, 1:41 PM

## 2017-03-03 NOTE — Patient Instructions (Addendum)
Ashley Williams  03/03/2017   Your procedure is scheduled on: 03/11/2017   Report to [facility] Main  Entrance  Report to admitting at 0615am  Call this number if you have problems the morning of surgery [phone]   Remember: Do not eat food or drink liquids :After Midnight.     Take these medicines the morning of surgery with A SIP OF WATER: none                                You may not have any metal on your body including hair pins and              piercings  Do not wear jewelry, make-up, lotions, powders or perfumes, deodorant             Do not wear nail polish.  Do not shave  48 hours prior to surgery.               Do not bring valuables to the hospital. [place].  Contacts, dentures or bridgework may not be worn into surgery.  Leave suitcase in the car. After surgery it may be brought to your room.                       Please read over the following fact sheets you were given: _____________________________________________________________________             [facility] - Preparing for Surgery  Before surgery, you can play an important role.  Because skin is not sterile, your skin needs to be as free of germs as possible.  You can reduce the number of germs on you skin by washing with CHG (chlorahexidine gluconate) soap before surgery.  CHG is an antiseptic cleaner which kills germs and bonds with the skin to continue killing germs even after washing.  Please DO NOT use if you have an allergy to CHG or antibacterial soaps.  If your skin becomes reddened/irritated stop using the CHG and inform your nurse when you arrive at Short Stay.  Do not shave (including legs and underarms) for at least 48 hours prior to the first CHG shower.  You may shave your face.  Please follow these instructions carefully:   1.  Shower with CHG Soap the night before surgery and the                                 morning of Surgery.  2.  If you choose to wash your hair, wash your hair first as usual with your       normal shampoo.  3.  After you shampoo, rinse your hair and body thoroughly to remove the                      Shampoo.  4.  Use CHG as you would any other liquid soap.  You can apply chg directly       to the skin and wash gently with scrungie or a clean washcloth.  5.  Apply the CHG Soap to your body ONLY FROM THE NECK DOWN.  Do not use on open wounds or open sores.  Avoid contact with your eyes,       ears, mouth and genitals (private parts).  Wash genitals (private parts)       with your normal soap.  6.  Wash thoroughly, paying special attention to the area where your surgery        will be performed.  7.  Thoroughly rinse your body with warm water from the neck down.  8.  DO NOT shower/wash with your normal soap after using and rinsing off       the CHG Soap.  9.  Pat yourself dry with a clean towel.            10.  Wear clean pajamas.            11.  Place clean sheets on your bed the night of your first shower and do not        sleep with pets.  Day of Surgery  Do not apply any lotions/deoderants the morning of surgery.  Please wear clean clothes to the hospital/surgery center.   WHAT IS A BLOOD TRANSFUSION? Blood Transfusion Information  A transfusion is the replacement of blood or some of its parts. Blood is made up of multiple cells which provide different functions.  Red blood cells carry oxygen and are used for blood loss replacement.  White blood cells fight against infection.  Platelets control bleeding.  Plasma helps clot blood.  Other blood products are available for specialized needs, such as hemophilia or other clotting disorders. BEFORE THE TRANSFUSION  Who gives blood for transfusions?   Healthy volunteers who are fully evaluated to make sure their blood is safe. This is blood bank blood. Transfusion therapy is the safest it has ever been in the practice  of medicine. Before blood is taken from a donor, a complete history is taken to make sure that person has no history of diseases nor engages in risky social behavior (examples are intravenous drug use or sexual activity with multiple partners). The donor's travel history is screened to minimize risk of transmitting infections, such as malaria. The donated blood is tested for signs of infectious diseases, such as HIV and hepatitis. The blood is then tested to be sure it is compatible with you in order to minimize the chance of a transfusion reaction. If you or a relative donates blood, this is often done in anticipation of surgery and is not appropriate for emergency situations. It takes many days to process the donated blood. RISKS AND COMPLICATIONS Although transfusion therapy is very safe and saves many lives, the main dangers of transfusion include:   Getting an infectious disease.  Developing a transfusion reaction. This is an allergic reaction to something in the blood you were given. Every precaution is taken to prevent this. The decision to have a blood transfusion has been considered carefully by your caregiver before blood is given. Blood is not given unless the benefits outweigh the risks. AFTER THE TRANSFUSION  Right after receiving a blood transfusion, you will usually feel much better and more energetic. This is especially true if your red blood cells have gotten low (anemic). The transfusion raises the level of the red blood cells which carry oxygen, and this usually causes an energy increase.  The nurse administering the transfusion will monitor you carefully for complications. HOME CARE INSTRUCTIONS  No special instructions are needed after a transfusion. You may find your energy is better. Speak with  your caregiver about any limitations on activity for underlying diseases you may have. SEEK MEDICAL CARE IF:   Your condition is not improving after your transfusion.  You develop  redness or irritation at the intravenous (IV) site. SEEK IMMEDIATE MEDICAL CARE IF:  Any of the following symptoms occur over the next 12 hours:  Shaking chills.  You have a temperature by mouth above 102 F (38.9 C), not controlled by medicine.  Chest, back, or muscle pain.  People around you feel you are not acting correctly or are confused.  Shortness of breath or difficulty breathing.  Dizziness and fainting.  You get a rash or develop hives.  You have a decrease in urine output.  Your urine turns a dark color or changes to pink, red, or brown. Any of the following symptoms occur over the next 10 days:  You have a temperature by mouth above 102 F (38.9 C), not controlled by medicine.  Shortness of breath.  Weakness after normal activity.  The white part of the eye turns yellow (jaundice).  You have a decrease in the amount of urine or are urinating less often.  Your urine turns a dark color or changes to pink, red, or brown. Document Released: 01/19/2000 Document Revised: 04/15/2011 Document Reviewed: 09/07/2007 ExitCare Patient Information 2014 [place].  _______________________________________________________________________  Incentive Spirometer  An incentive spirometer is a tool that can help keep your lungs clear and active. This tool measures how well you are filling your lungs with each breath. Taking long deep breaths may help reverse or decrease the chance of developing breathing (pulmonary) problems (especially infection) following:  A long period of time when you are unable to move or be active. BEFORE THE PROCEDURE   If the spirometer includes an indicator to show your best effort, your nurse or respiratory therapist will set it to a desired goal.  If possible, sit up straight or lean slightly forward. Try not to slouch.  Hold the incentive spirometer in an upright position. INSTRUCTIONS FOR USE  1. Sit on the edge of your bed if possible,  or sit up as far as you can in bed or on a chair. 2. Hold the incentive spirometer in an upright position. 3. Breathe out normally. 4. Place the mouthpiece in your mouth and seal your lips tightly around it. 5. Breathe in slowly and as deeply as possible, raising the piston or the ball toward the top of the column. 6. Hold your breath for 3-5 seconds or for as long as possible. Allow the piston or ball to fall to the bottom of the column. 7. Remove the mouthpiece from your mouth and breathe out normally. 8. Rest for a few seconds and repeat Steps 1 through 7 at least 10 times every 1-2 hours when you are awake. Take your time and take a few normal breaths between deep breaths. 9. The spirometer may include an indicator to show your best effort. Use the indicator as a goal to work toward during each repetition. 10. After each set of 10 deep breaths, practice coughing to be sure your lungs are clear. If you have an incision (the cut made at the time of surgery), support your incision when coughing by placing a pillow or rolled up towels firmly against it. Once you are able to get out of bed, walk around indoors and cough well. You may stop using the incentive spirometer when instructed by your caregiver.  RISKS AND COMPLICATIONS  Take your time so you  do not get dizzy or light-headed.  If you are in pain, you may need to take or ask for pain medication before doing incentive spirometry. It is harder to take a deep breath if you are having pain. AFTER USE  Rest and breathe slowly and easily.  It can be helpful to keep track of a log of your progress. Your caregiver can provide you with a simple table to help with this. If you are using the spirometer at home, follow these instructions: [place] IF:   You are having difficultly using the spirometer.  You have trouble using the spirometer as often as instructed.  Your pain medication is not giving enough relief while using the  spirometer.  You develop fever of 100.5 F (38.1 C) or higher. SEEK IMMEDIATE MEDICAL CARE IF:   You cough up bloody sputum that had not been present before.  You develop fever of 102 F (38.9 C) or greater.  You develop worsening pain at or near the incision site. MAKE SURE YOU:   Understand these instructions.  Will watch your condition.  Will get help right away if you are not doing well or get worse. Document Released: 06/03/2006 Document Revised: 04/15/2011 Document Reviewed: 08/04/2006 [facility] Patient Information 2014 [place], [place].   ________________________________________________________________________

## 2017-03-05 ENCOUNTER — Inpatient Hospital Stay (HOSPITAL_COMMUNITY): Admission: RE | Admit: 2017-03-05 | Payer: No Typology Code available for payment source | Source: Ambulatory Visit

## 2017-03-05 ENCOUNTER — Other Ambulatory Visit: Payer: Self-pay

## 2017-03-05 ENCOUNTER — Encounter (HOSPITAL_COMMUNITY)
Admission: RE | Admit: 2017-03-05 | Discharge: 2017-03-05 | Disposition: A | Discharge status: Home / Self Care | Payer: BLUE CROSS/BLUE SHIELD | Source: Ambulatory Visit | Attending: Orthopedic Surgery | Admitting: Orthopedic Surgery

## 2017-03-05 ENCOUNTER — Encounter (HOSPITAL_COMMUNITY): Payer: Self-pay

## 2017-03-05 DIAGNOSIS — Z0183 Encounter for blood typing: Secondary | ICD-10-CM | POA: Diagnosis not present

## 2017-03-05 DIAGNOSIS — Z01812 Encounter for preprocedural laboratory examination: Secondary | ICD-10-CM | POA: Diagnosis not present

## 2017-03-05 DIAGNOSIS — M1611 Unilateral primary osteoarthritis, right hip: Secondary | ICD-10-CM | POA: Diagnosis not present

## 2017-03-05 HISTORY — DX: Other complications of anesthesia, initial encounter: T88.59XA

## 2017-03-05 HISTORY — DX: Adverse effect of unspecified anesthetic, initial encounter: T41.45XA

## 2017-03-05 HISTORY — DX: Unspecified osteoarthritis, unspecified site: M19.90

## 2017-03-05 LAB — CBC
HCT: 38.1 % (ref 36.0–46.0)
Hemoglobin: 13.2 g/dL (ref 12.0–15.0)
MCH: 33.4 pg (ref 26.0–34.0)
MCHC: 34.6 g/dL (ref 30.0–36.0)
MCV: 96.5 fL (ref 78.0–100.0)
Platelets: 319 10*3/uL (ref 150–400)
RBC: 3.95 MIL/uL (ref 3.87–5.11)
RDW: 13.1 % (ref 11.5–15.5)
WBC: 3.8 10*3/uL — ABNORMAL LOW (ref 4.0–10.5)

## 2017-03-05 LAB — SURGICAL PCR SCREEN
MRSA, PCR: NEGATIVE
Staphylococcus aureus: NEGATIVE

## 2017-03-05 LAB — ABO/RH: ABO/RH(D): O POS

## 2017-03-05 NOTE — Progress Notes (Signed)
02/14/17-Clearance- Dr Colin Benton on chasrt along with LOV note.

## 2017-03-10 ENCOUNTER — Encounter (HOSPITAL_COMMUNITY): Payer: Self-pay | Admitting: Certified Registered"

## 2017-03-10 MED ORDER — TRANEXAMIC ACID 1000 MG/10ML IV SOLN
1000.0000 mg | INTRAVENOUS | Status: AC
  Administered 2017-03-11: 1000 mg via INTRAVENOUS
  Filled 2017-03-10: qty 1100

## 2017-03-11 ENCOUNTER — Encounter (HOSPITAL_COMMUNITY): Payer: Self-pay

## 2017-03-11 ENCOUNTER — Inpatient Hospital Stay (HOSPITAL_COMMUNITY): Payer: BLUE CROSS/BLUE SHIELD

## 2017-03-11 ENCOUNTER — Other Ambulatory Visit: Payer: Self-pay

## 2017-03-11 ENCOUNTER — Inpatient Hospital Stay (HOSPITAL_COMMUNITY): Payer: BLUE CROSS/BLUE SHIELD | Admitting: Certified Registered"

## 2017-03-11 ENCOUNTER — Encounter (HOSPITAL_COMMUNITY)
Admission: RE | Disposition: A | Discharge status: Home / Self Care | Payer: Self-pay | Source: Ambulatory Visit | Attending: Orthopedic Surgery

## 2017-03-11 ENCOUNTER — Inpatient Hospital Stay (HOSPITAL_COMMUNITY)
Admission: RE | Admit: 2017-03-11 | Discharge: 2017-03-13 | DRG: 470 | Disposition: A | Discharge status: Home / Self Care | Payer: BLUE CROSS/BLUE SHIELD | Source: Ambulatory Visit | Attending: Orthopedic Surgery | Admitting: Orthopedic Surgery

## 2017-03-11 DIAGNOSIS — M160 Bilateral primary osteoarthritis of hip: Secondary | ICD-10-CM | POA: Diagnosis not present

## 2017-03-11 DIAGNOSIS — Z87891 Personal history of nicotine dependence: Secondary | ICD-10-CM | POA: Diagnosis not present

## 2017-03-11 DIAGNOSIS — D62 Acute posthemorrhagic anemia: Secondary | ICD-10-CM | POA: Diagnosis not present

## 2017-03-11 DIAGNOSIS — Z96649 Presence of unspecified artificial hip joint: Secondary | ICD-10-CM

## 2017-03-11 DIAGNOSIS — Z800 Family history of malignant neoplasm of digestive organs: Secondary | ICD-10-CM | POA: Diagnosis not present

## 2017-03-11 DIAGNOSIS — Z9181 History of falling: Secondary | ICD-10-CM

## 2017-03-11 DIAGNOSIS — Z96641 Presence of right artificial hip joint: Secondary | ICD-10-CM

## 2017-03-11 DIAGNOSIS — M1611 Unilateral primary osteoarthritis, right hip: Secondary | ICD-10-CM | POA: Diagnosis not present

## 2017-03-11 DIAGNOSIS — G8929 Other chronic pain: Secondary | ICD-10-CM | POA: Diagnosis not present

## 2017-03-11 DIAGNOSIS — F339 Major depressive disorder, recurrent, unspecified: Secondary | ICD-10-CM | POA: Diagnosis not present

## 2017-03-11 DIAGNOSIS — Z8049 Family history of malignant neoplasm of other genital organs: Secondary | ICD-10-CM | POA: Diagnosis not present

## 2017-03-11 DIAGNOSIS — R269 Unspecified abnormalities of gait and mobility: Secondary | ICD-10-CM | POA: Diagnosis not present

## 2017-03-11 DIAGNOSIS — Z471 Aftercare following joint replacement surgery: Secondary | ICD-10-CM | POA: Diagnosis not present

## 2017-03-11 HISTORY — PX: TOTAL HIP ARTHROPLASTY: SHX124

## 2017-03-11 LAB — TYPE AND SCREEN
ABO/RH(D): O POS
ANTIBODY SCREEN: NEGATIVE

## 2017-03-11 SURGERY — ARTHROPLASTY, HIP, TOTAL, ANTERIOR APPROACH
Anesthesia: Spinal | Site: Hip | Laterality: Right

## 2017-03-11 MED ORDER — FENTANYL CITRATE (PF) 250 MCG/5ML IJ SOLN
INTRAMUSCULAR | Status: AC
  Filled 2017-03-11: qty 5

## 2017-03-11 MED ORDER — ACETAMINOPHEN 650 MG RE SUPP: 650.0000 mg | RECTAL | Status: DC | PRN

## 2017-03-11 MED ORDER — HYDROMORPHONE HCL 1 MG/ML IJ SOLN
0.2500 mg | INTRAMUSCULAR | Status: DC | PRN
  Administered 2017-03-11 (×4): 0.5 mg via INTRAVENOUS

## 2017-03-11 MED ORDER — ONDANSETRON HCL 4 MG PO TABS
4.0000 mg | ORAL_TABLET | Freq: Three times a day (TID) | ORAL | Status: DC | PRN
  Filled 2017-03-11: qty 1

## 2017-03-11 MED ORDER — PROPOFOL 10 MG/ML IV BOLUS
INTRAVENOUS | Status: AC
  Filled 2017-03-11: qty 20

## 2017-03-11 MED ORDER — FERROUS SULFATE 325 (65 FE) MG PO TABS
325.0000 mg | ORAL_TABLET | Freq: Three times a day (TID) | ORAL | Status: DC
  Administered 2017-03-12 – 2017-03-13 (×5): 325 mg via ORAL
  Filled 2017-03-11 (×5): qty 1

## 2017-03-11 MED ORDER — DEXAMETHASONE SODIUM PHOSPHATE 10 MG/ML IJ SOLN
10.0000 mg | Freq: Once | INTRAMUSCULAR | Status: AC
  Administered 2017-03-12: 10 mg via INTRAVENOUS
  Filled 2017-03-11: qty 1

## 2017-03-11 MED ORDER — ACETAMINOPHEN 10 MG/ML IV SOLN: 1000.0000 mg | Freq: Four times a day (QID) | INTRAVENOUS | Status: DC

## 2017-03-11 MED ORDER — ACETAMINOPHEN 10 MG/ML IV SOLN
INTRAVENOUS | Status: AC
  Filled 2017-03-11: qty 100

## 2017-03-11 MED ORDER — SODIUM CHLORIDE 0.9 % IV SOLN
INTRAVENOUS | Status: DC
  Administered 2017-03-11 – 2017-03-12 (×2): via INTRAVENOUS

## 2017-03-11 MED ORDER — HYDROCODONE-ACETAMINOPHEN 7.5-325 MG PO TABS
1.0000 | ORAL_TABLET | ORAL | Status: DC | PRN
  Administered 2017-03-11: 1 via ORAL

## 2017-03-11 MED ORDER — ONDANSETRON HCL 4 MG/2ML IJ SOLN
INTRAMUSCULAR | Status: AC
Start: 2017-03-11 — End: 2017-03-11
  Filled 2017-03-11: qty 2

## 2017-03-11 MED ORDER — DOCUSATE SODIUM 100 MG PO CAPS
100.0000 mg | ORAL_CAPSULE | Freq: Two times a day (BID) | ORAL | Status: DC
  Administered 2017-03-11 – 2017-03-13 (×4): 100 mg via ORAL
  Filled 2017-03-11 (×4): qty 1

## 2017-03-11 MED ORDER — HYDROCODONE-ACETAMINOPHEN 7.5-325 MG PO TABS
ORAL_TABLET | ORAL | Status: AC
  Administered 2017-03-11: 1 via ORAL
  Filled 2017-03-11: qty 1

## 2017-03-11 MED ORDER — CHLORHEXIDINE GLUCONATE 4 % EX LIQD: 60.0000 mL | Freq: Once | CUTANEOUS | Status: DC

## 2017-03-11 MED ORDER — CEFAZOLIN SODIUM-DEXTROSE 2-4 GM/100ML-% IV SOLN
INTRAVENOUS | Status: AC
  Filled 2017-03-11: qty 100

## 2017-03-11 MED ORDER — BISACODYL 10 MG RE SUPP: 10.0000 mg | Freq: Every day | RECTAL | Status: DC | PRN

## 2017-03-11 MED ORDER — CEFAZOLIN SODIUM-DEXTROSE 2-4 GM/100ML-% IV SOLN
2.0000 g | INTRAVENOUS | Status: AC
  Administered 2017-03-11: 2 g via INTRAVENOUS

## 2017-03-11 MED ORDER — FERROUS SULFATE 325 (65 FE) MG PO TABS: 325.0000 mg | ORAL_TABLET | Freq: Three times a day (TID) | ORAL | 3 refills | Status: DC

## 2017-03-11 MED ORDER — METHOCARBAMOL 1000 MG/10ML IJ SOLN
500.0000 mg | Freq: Four times a day (QID) | INTRAVENOUS | Status: DC | PRN
  Administered 2017-03-11: 500 mg via INTRAVENOUS
  Filled 2017-03-11: qty 550

## 2017-03-11 MED ORDER — PROPOFOL 500 MG/50ML IV EMUL
INTRAVENOUS | Status: DC | PRN
  Administered 2017-03-11: 50 ug/kg/min via INTRAVENOUS

## 2017-03-11 MED ORDER — ACETAMINOPHEN 10 MG/ML IV SOLN: 1000.0000 mg | Freq: Once | INTRAVENOUS | Status: DC | PRN

## 2017-03-11 MED ORDER — HYDROMORPHONE HCL 1 MG/ML IJ SOLN
INTRAMUSCULAR | Status: AC
  Administered 2017-03-11: 0.5 mg via INTRAVENOUS
  Filled 2017-03-11: qty 2

## 2017-03-11 MED ORDER — PHENYLEPHRINE HCL 10 MG/ML IJ SOLN
INTRAMUSCULAR | Status: DC | PRN
  Administered 2017-03-11: 25 ug/min via INTRAVENOUS

## 2017-03-11 MED ORDER — DEXAMETHASONE SODIUM PHOSPHATE 10 MG/ML IJ SOLN
10.0000 mg | Freq: Once | INTRAMUSCULAR | Status: AC
  Administered 2017-03-11: 10 mg via INTRAVENOUS

## 2017-03-11 MED ORDER — FENTANYL CITRATE (PF) 100 MCG/2ML IJ SOLN
25.0000 ug | INTRAMUSCULAR | Status: DC | PRN
  Administered 2017-03-11: 50 ug via INTRAVENOUS

## 2017-03-11 MED ORDER — HYDROMORPHONE HCL 1 MG/ML IJ SOLN
INTRAMUSCULAR | Status: AC
  Filled 2017-03-11: qty 1

## 2017-03-11 MED ORDER — SODIUM CHLORIDE 0.9 % IR SOLN
Status: DC | PRN
  Administered 2017-03-11: 1000 mL

## 2017-03-11 MED ORDER — PROPOFOL 10 MG/ML IV BOLUS
INTRAVENOUS | Status: AC
Start: 2017-03-11 — End: ?
  Filled 2017-03-11: qty 20

## 2017-03-11 MED ORDER — LIP MEDEX EX OINT
TOPICAL_OINTMENT | CUTANEOUS | Status: AC
  Filled 2017-03-11: qty 7

## 2017-03-11 MED ORDER — MIDAZOLAM HCL 2 MG/2ML IJ SOLN
INTRAMUSCULAR | Status: DC | PRN
  Administered 2017-03-11: 1 mg via INTRAVENOUS
  Administered 2017-03-11 (×2): 0.5 mg via INTRAVENOUS
  Administered 2017-03-11 (×2): 1 mg via INTRAVENOUS

## 2017-03-11 MED ORDER — PROMETHAZINE HCL 25 MG/ML IJ SOLN
6.2500 mg | INTRAMUSCULAR | Status: DC | PRN
  Administered 2017-03-11: 12.5 mg via INTRAVENOUS

## 2017-03-11 MED ORDER — CEFAZOLIN SODIUM-DEXTROSE 2-4 GM/100ML-% IV SOLN
2.0000 g | Freq: Four times a day (QID) | INTRAVENOUS | Status: AC
  Administered 2017-03-11 (×2): 2 g via INTRAVENOUS
  Filled 2017-03-11 (×2): qty 100

## 2017-03-11 MED ORDER — PROMETHAZINE HCL 25 MG/ML IJ SOLN
INTRAMUSCULAR | Status: AC
  Filled 2017-03-11: qty 1

## 2017-03-11 MED ORDER — METOCLOPRAMIDE HCL 5 MG/ML IJ SOLN
5.0000 mg | Freq: Three times a day (TID) | INTRAMUSCULAR | Status: DC | PRN
  Administered 2017-03-11: 5 mg via INTRAVENOUS
  Filled 2017-03-11: qty 2

## 2017-03-11 MED ORDER — MAGNESIUM CITRATE PO SOLN: 1.0000 | Freq: Once | ORAL | Status: DC | PRN

## 2017-03-11 MED ORDER — ACETAMINOPHEN 325 MG PO TABS: 650.0000 mg | ORAL_TABLET | ORAL | Status: DC | PRN

## 2017-03-11 MED ORDER — FENTANYL CITRATE (PF) 100 MCG/2ML IJ SOLN
INTRAMUSCULAR | Status: AC
  Administered 2017-03-11: 50 ug via INTRAVENOUS
  Filled 2017-03-11: qty 2

## 2017-03-11 MED ORDER — ONDANSETRON HCL 4 MG/2ML IJ SOLN
INTRAMUSCULAR | Status: DC | PRN
  Administered 2017-03-11: 4 mg via INTRAVENOUS

## 2017-03-11 MED ORDER — CELECOXIB 200 MG PO CAPS
200.0000 mg | ORAL_CAPSULE | Freq: Two times a day (BID) | ORAL | Status: DC
  Administered 2017-03-11 – 2017-03-12 (×3): 200 mg via ORAL
  Filled 2017-03-11 (×3): qty 1

## 2017-03-11 MED ORDER — MIDAZOLAM HCL 2 MG/2ML IJ SOLN
INTRAMUSCULAR | Status: AC
  Filled 2017-03-11: qty 2

## 2017-03-11 MED ORDER — HYDROCODONE-ACETAMINOPHEN 7.5-325 MG PO TABS
2.0000 | ORAL_TABLET | ORAL | Status: DC | PRN
  Administered 2017-03-11 – 2017-03-13 (×8): 2 via ORAL
  Filled 2017-03-11 (×8): qty 2

## 2017-03-11 MED ORDER — PHENYLEPHRINE HCL-NACL 10-0.9 MG/250ML-% IV SOLN
INTRAVENOUS | Status: AC
  Filled 2017-03-11: qty 250

## 2017-03-11 MED ORDER — TRANEXAMIC ACID 1000 MG/10ML IV SOLN
1000.0000 mg | Freq: Once | INTRAVENOUS | Status: AC
  Administered 2017-03-11: 1000 mg via INTRAVENOUS
  Filled 2017-03-11: qty 1100

## 2017-03-11 MED ORDER — PROPOFOL 10 MG/ML IV BOLUS
INTRAVENOUS | Status: DC | PRN
  Administered 2017-03-11 (×2): 20 mg via INTRAVENOUS

## 2017-03-11 MED ORDER — METHOCARBAMOL 500 MG PO TABS: 500.0000 mg | ORAL_TABLET | Freq: Four times a day (QID) | ORAL | 0 refills | Status: DC | PRN

## 2017-03-11 MED ORDER — HYDROCODONE-ACETAMINOPHEN 7.5-325 MG PO TABS: 1.0000 | ORAL_TABLET | ORAL | 0 refills | Status: DC | PRN

## 2017-03-11 MED ORDER — ASPIRIN 81 MG PO CHEW: 81.0000 mg | CHEWABLE_TABLET | Freq: Two times a day (BID) | ORAL | 0 refills | Status: AC

## 2017-03-11 MED ORDER — METHOCARBAMOL 500 MG PO TABS
500.0000 mg | ORAL_TABLET | Freq: Four times a day (QID) | ORAL | Status: DC | PRN
  Administered 2017-03-11 – 2017-03-13 (×2): 500 mg via ORAL
  Filled 2017-03-11 (×2): qty 1

## 2017-03-11 MED ORDER — ALUM & MAG HYDROXIDE-SIMETH 200-200-20 MG/5ML PO SUSP: 15.0000 mL | ORAL | Status: DC | PRN

## 2017-03-11 MED ORDER — LACTATED RINGERS IV SOLN
INTRAVENOUS | Status: DC
  Administered 2017-03-11 (×3): via INTRAVENOUS

## 2017-03-11 MED ORDER — FENTANYL CITRATE (PF) 100 MCG/2ML IJ SOLN
25.0000 ug | INTRAMUSCULAR | Status: DC | PRN
  Administered 2017-03-11 (×2): 25 ug via INTRAVENOUS

## 2017-03-11 MED ORDER — FENTANYL CITRATE (PF) 250 MCG/5ML IJ SOLN
INTRAMUSCULAR | Status: DC | PRN
  Administered 2017-03-11: 50 ug via INTRAVENOUS
  Administered 2017-03-11 (×6): 25 ug via INTRAVENOUS
  Administered 2017-03-11: 50 ug via INTRAVENOUS

## 2017-03-11 MED ORDER — MENTHOL 3 MG MT LOZG: 1.0000 | LOZENGE | OROMUCOSAL | Status: DC | PRN

## 2017-03-11 MED ORDER — ASPIRIN 81 MG PO CHEW
81.0000 mg | CHEWABLE_TABLET | Freq: Two times a day (BID) | ORAL | Status: DC
  Administered 2017-03-11 – 2017-03-13 (×4): 81 mg via ORAL
  Filled 2017-03-11 (×4): qty 1

## 2017-03-11 MED ORDER — DIPHENHYDRAMINE HCL 12.5 MG/5ML PO ELIX: 12.5000 mg | ORAL_SOLUTION | ORAL | Status: DC | PRN

## 2017-03-11 MED ORDER — METOCLOPRAMIDE HCL 5 MG PO TABS
5.0000 mg | ORAL_TABLET | Freq: Three times a day (TID) | ORAL | Status: DC | PRN
  Filled 2017-03-11: qty 2

## 2017-03-11 MED ORDER — DOCUSATE SODIUM 100 MG PO CAPS: 100.0000 mg | ORAL_CAPSULE | Freq: Two times a day (BID) | ORAL | 0 refills | Status: DC

## 2017-03-11 MED ORDER — PHENOL 1.4 % MT LIQD: 1.0000 | OROMUCOSAL | Status: DC | PRN

## 2017-03-11 MED ORDER — POLYETHYLENE GLYCOL 3350 17 G PO PACK: 17.0000 g | PACK | Freq: Two times a day (BID) | ORAL | 0 refills | Status: DC

## 2017-03-11 MED ORDER — POLYETHYLENE GLYCOL 3350 17 G PO PACK
17.0000 g | PACK | Freq: Two times a day (BID) | ORAL | Status: DC
  Administered 2017-03-11 – 2017-03-13 (×4): 17 g via ORAL
  Filled 2017-03-11 (×4): qty 1

## 2017-03-11 MED ORDER — KETOROLAC TROMETHAMINE 30 MG/ML IJ SOLN: 30.0000 mg | Freq: Once | INTRAMUSCULAR | Status: DC | PRN

## 2017-03-11 MED ORDER — HYDROMORPHONE HCL 1 MG/ML IJ SOLN
0.5000 mg | INTRAMUSCULAR | Status: DC | PRN
  Administered 2017-03-11: 1 mg via INTRAVENOUS
  Administered 2017-03-11: 0.5 mg via INTRAVENOUS
  Filled 2017-03-11: qty 1

## 2017-03-11 MED ORDER — BUPIVACAINE IN DEXTROSE 0.75-8.25 % IT SOLN
INTRATHECAL | Status: DC | PRN
  Administered 2017-03-11: 1.6 mL via INTRATHECAL

## 2017-03-11 MED ORDER — ONDANSETRON HCL 4 MG/2ML IJ SOLN
4.0000 mg | Freq: Three times a day (TID) | INTRAMUSCULAR | Status: DC | PRN
  Administered 2017-03-11: 4 mg via INTRAVENOUS

## 2017-03-11 MED ORDER — MEPERIDINE HCL 50 MG/ML IJ SOLN: 6.2500 mg | INTRAMUSCULAR | Status: DC | PRN

## 2017-03-11 MED ORDER — LIDOCAINE 2% (20 MG/ML) 5 ML SYRINGE
INTRAMUSCULAR | Status: DC | PRN
  Administered 2017-03-11: 40 mg via INTRAVENOUS

## 2017-03-11 MED ORDER — STERILE WATER FOR IRRIGATION IR SOLN
Status: DC | PRN
  Administered 2017-03-11: 2000 mL

## 2017-03-11 SURGICAL SUPPLY — 38 items
ADH SKN CLS APL DERMABOND .7 (GAUZE/BANDAGES/DRESSINGS) ×1
BAG SPEC THK2 15X12 ZIP CLS (MISCELLANEOUS) ×1
BAG ZIPLOCK 12X15 (MISCELLANEOUS) ×1 IMPLANT
BLADE SAG 18X100X1.27 (BLADE) ×2 IMPLANT
CAPT HIP TOTAL 2 ×1 IMPLANT
CLOTH BEACON ORANGE TIMEOUT ST (SAFETY) ×2 IMPLANT
COVER PERINEAL POST (MISCELLANEOUS) ×2 IMPLANT
COVER SURGICAL LIGHT HANDLE (MISCELLANEOUS) ×2 IMPLANT
DERMABOND ADVANCED (GAUZE/BANDAGES/DRESSINGS) ×1
DERMABOND ADVANCED .7 DNX12 (GAUZE/BANDAGES/DRESSINGS) ×1 IMPLANT
DRAPE STERI IOBAN 125X83 (DRAPES) ×2 IMPLANT
DRAPE U-SHAPE 47X51 STRL (DRAPES) ×4 IMPLANT
DRESSING AQUACEL AG SP 3.5X6 (GAUZE/BANDAGES/DRESSINGS) IMPLANT
DRSG AQUACEL AG SP 3.5X6 (GAUZE/BANDAGES/DRESSINGS) ×2
DURAPREP 26ML APPLICATOR (WOUND CARE) ×2 IMPLANT
ELECT REM PT RETURN 15FT ADLT (MISCELLANEOUS) ×2 IMPLANT
GLOVE BIOGEL M STRL SZ7.5 (GLOVE) ×2 IMPLANT
GLOVE BIOGEL PI IND STRL 7.5 (GLOVE) ×1 IMPLANT
GLOVE BIOGEL PI IND STRL 8.5 (GLOVE) ×1 IMPLANT
GLOVE BIOGEL PI INDICATOR 7.5 (GLOVE) ×5
GLOVE BIOGEL PI INDICATOR 8.5 (GLOVE) ×1
GLOVE ECLIPSE 8.0 STRL XLNG CF (GLOVE) ×4 IMPLANT
GLOVE ORTHO TXT STRL SZ7.5 (GLOVE) ×2 IMPLANT
GLOVE SURG SS PI 7.0 STRL IVOR (GLOVE) ×1 IMPLANT
GLOVE SURG SS PI 7.5 STRL IVOR (GLOVE) ×1 IMPLANT
GOWN SPEC L3 XXLG W/TWL (GOWN DISPOSABLE) ×1 IMPLANT
GOWN STRL REUS W/TWL LRG LVL3 (GOWN DISPOSABLE) ×2 IMPLANT
GOWN STRL REUS W/TWL XL LVL3 (GOWN DISPOSABLE) ×3 IMPLANT
HOLDER FOLEY CATH W/STRAP (MISCELLANEOUS) ×2 IMPLANT
PACK ANTERIOR HIP CUSTOM (KITS) ×2 IMPLANT
SUT MNCRL AB 4-0 PS2 18 (SUTURE) ×2 IMPLANT
SUT STRATAFIX SPIRAL PDS+ 70CM (SUTURE) ×4
SUT VIC AB 1 CT1 36 (SUTURE) ×6 IMPLANT
SUT VIC AB 2-0 CT1 27 (SUTURE) ×4
SUT VIC AB 2-0 CT1 TAPERPNT 27 (SUTURE) ×2 IMPLANT
SUTURE STRATFX SPIRL PDS+ 70CM (SUTURE) ×1 IMPLANT
TRAY FOLEY CATH SILVER 14FR (SET/KITS/TRAYS/PACK) ×1 IMPLANT
YANKAUER SUCT BULB TIP 10FT TU (MISCELLANEOUS) ×1 IMPLANT

## 2017-03-11 NOTE — Anesthesia Procedure Notes (Signed)
Date/Time: 03/11/2017 8:25 AM Performed by: Cynda Familia, CRNA Pre-anesthesia Checklist: Patient identified, Emergency Drugs available, Suction available and Patient being monitored Patient Re-evaluated:Patient Re-evaluated prior to induction Oxygen Delivery Method: Simple face mask Placement Confirmation: positive ETCO2 and breath sounds checked- equal and bilateral Dental Injury: Teeth and Oropharynx as per pre-operative assessment

## 2017-03-11 NOTE — Progress Notes (Signed)
PT Cancellation Note  Patient Details Name: Ashley Williams MRN: [id number] DOB: [birth year]   Cancelled Treatment:    Reason Eval/Treat Not Completed: Not appropriate for PT on POD 0 per nursing. Will check back on tomorrow.   Weston Anna, MPT Pager: [phone]

## 2017-03-11 NOTE — Anesthesia Procedure Notes (Signed)
Spinal  Patient location during procedure: OR End time: 03/11/2017 8:33 AM Staffing Resident/CRNA: Cynda Familia, CRNA Performed: resident/CRNA  Preanesthetic Checklist Completed: patient identified, site marked, surgical consent, pre-op evaluation, timeout performed, IV checked, risks and benefits discussed and monitors and equipment checked Spinal Block Patient position: sitting Prep: ChloraPrep and site prepped and draped Patient monitoring: heart rate, continuous pulse ox, blood pressure and cardiac monitor Approach: midline Location: L3-4 Injection technique: single-shot Needle Needle type: Sprotte  Needle gauge: 24 G Additional Notes Expiration date of tray noted and within date.   Patient tolerated procedure well. Prep dry at time of skin puncture--- Hatchette present assisted and supervised procedure requested 1.6 cc of local be injected

## 2017-03-11 NOTE — Anesthesia Preprocedure Evaluation (Signed)
Anesthesia Evaluation  Patient identified by MRN, date of birth, ID band Patient awake    Reviewed: Allergy & Precautions, H&P , NPO status , Patient's Chart, lab work & pertinent test results  Airway Mallampati: I  TM Distance: >3 FB Neck ROM: full    Dental no notable dental hx. (+) Teeth Intact   Pulmonary former smoker,    Pulmonary exam normal breath sounds clear to auscultation       Cardiovascular negative cardio ROS Normal cardiovascular exam Rhythm:regular Rate:Normal     Neuro/Psych PSYCHIATRIC DISORDERS Depression negative neurological ROS     GI/Hepatic negative GI ROS, Neg liver ROS,   Endo/Other  negative endocrine ROS  Renal/GU negative Renal ROS     Musculoskeletal   Abdominal Normal abdominal exam  (+)   Peds  Hematology negative hematology ROS (+)   Anesthesia Other Findings   Reproductive/Obstetrics negative OB ROS                             Anesthesia Physical Anesthesia Plan  ASA: II  Anesthesia Plan: Spinal   Post-op Pain Management:    Induction:   PONV Risk Score and Plan: 3 and Ondansetron and Dexamethasone  Airway Management Planned: Natural Airway, Nasal Cannula and Simple Face Mask  Additional Equipment:   Intra-op Plan:   Post-operative Plan:   Informed Consent: I have reviewed the patients History and Physical, chart, labs and discussed the procedure including the risks, benefits and alternatives for the proposed anesthesia with the patient or authorized representative who has indicated his/her understanding and acceptance.     Plan Discussed with: CRNA and Surgeon  Anesthesia Plan Comments:         Anesthesia Quick Evaluation

## 2017-03-11 NOTE — Transfer of Care (Signed)
Immediate Anesthesia Transfer of Care Note  Patient: Ashley Williams  Procedure(s) Performed: RIGHT TOTAL HIP ARTHROPLASTY ANTERIOR APPROACH (Right Hip)  Patient Location: PACU  Anesthesia Type:Spinal  Level of Consciousness: sedated  Airway & Oxygen Therapy: Patient Spontanous Breathing and Patient connected to face mask oxygen  Post-op Assessment: Report given to RN and Post -op Vital signs reviewed and stable  Post vital signs: Reviewed and stable  Last Vitals:  Vitals:   03/11/17 0627  BP: 126/81  Pulse: 64  Resp: 16  Temp: 37 C  SpO2: 100%    Last Pain:  Vitals:   03/11/17 0627  TempSrc: Oral         Complications: No apparent anesthesia complications

## 2017-03-11 NOTE — Anesthesia Postprocedure Evaluation (Signed)
Anesthesia Post Note  Patient: Ashley Williams  Procedure(s) Performed: RIGHT TOTAL HIP ARTHROPLASTY ANTERIOR APPROACH (Right Hip)     Patient location during evaluation: PACU Anesthesia Type: Spinal Level of consciousness: awake Pain management: pain level controlled Vital Signs Assessment: post-procedure vital signs reviewed and stable Cardiovascular status: stable Postop Assessment: no headache, no backache, spinal receding, patient able to bend at knees and no apparent nausea or vomiting Anesthetic complications: no    Last Vitals:  Vitals:   03/11/17 1145 03/11/17 1200  BP: 138/88 128/71  Pulse: 64 68  Resp: 18 13  Temp:  36.5 C  SpO2: 100% 100%    Last Pain:  Vitals:   03/11/17 1200  TempSrc:   PainSc: 6    Pain Goal:                 Sue Mcalexander JR,JOHN Ethelmae Ringel

## 2017-03-11 NOTE — Op Note (Signed)
NAME:  Ashley Williams                ACCOUNT NO.: [id number]      MEDICAL RECORD NO.: [id number]      FACILITY:  [facility]      PHYSICIAN:  Mauri Pole  DATE OF BIRTH:  February 11, 1955     DATE OF PROCEDURE:  03/11/2017                                 OPERATIVE REPORT         PREOPERATIVE DIAGNOSIS: Right  hip osteoarthritis.      POSTOPERATIVE DIAGNOSIS:  Right hip osteoarthritis.      PROCEDURE:  Right total hip replacement through an anterior approach   utilizing DePuy THR system, component size 89mm pinnacle cup, a size 32+4 neutral   Altrex liner, a size 2 Hi Tri Lock stem with a 32+1 delta ceramic   ball.      SURGEON:  Pietro Cassis. Alvan Dame, M.D.      ASSISTANT:  Nehemiah Massed, PA-C     ANESTHESIA:  Spinal.      SPECIMENS:  None.      COMPLICATIONS:  None.      BLOOD LOSS:  300 cc     DRAINS:  None.      INDICATION OF THE PROCEDURE:  Ashley Williams is a 63 y.o. female who had   presented to office for evaluation of right hip pain.  Radiographs revealed   progressive degenerative changes with bone-on-bone   articulation to the  hip joint.  The patient had painful limited range of   motion significantly affecting their overall quality of life.  The patient was failing to    respond to conservative measures, and at this point was ready   to proceed with more definitive measures.  The patient has noted progressive   degenerative changes in his hip, progressive problems and dysfunction   with regarding the hip prior to surgery.  Consent was obtained for   benefit of pain relief.  Specific risk of infection, DVT, component   failure, dislocation, need for revision surgery, as well discussion of   the anterior versus posterior approach were reviewed.  Consent was   obtained for benefit of anterior pain relief through an anterior   approach.      PROCEDURE IN DETAIL:  The patient was brought to operative theater.   Once adequate anesthesia, preoperative  antibiotics, 2 gm of Ancef, 1 gm of Tranexamic Acid, and 10 mg of Decadron administered.   The patient was positioned supine on the OSI Hanna table.  Once adequate   padding of boney process was carried out, we had predraped out the hip, and  used fluoroscopy to confirm orientation of the pelvis and position.      The right hip was then prepped and draped from proximal iliac crest to   mid thigh with shower curtain technique.      Time-out was performed identifying the patient, planned procedure, and   extremity.     An incision was then made 2 cm distal and lateral to the   anterior superior iliac spine extending over the orientation of the   tensor fascia lata muscle and sharp dissection was carried down to the   fascia of the muscle and protractor placed in the soft tissues.      The  fascia was then incised.  The muscle belly was identified and swept   laterally and retractor placed along the superior neck.  Following   cauterization of the circumflex vessels and removing some pericapsular   fat, a second cobra retractor was placed on the inferior neck.  A third   retractor was placed on the anterior acetabulum after elevating the   anterior rectus.  A L-capsulotomy was along the line of the   superior neck to the trochanteric fossa, then extended proximally and   distally.  Tag sutures were placed and the retractors were then placed   intracapsular.  We then identified the trochanteric fossa and   orientation of my neck cut, confirmed this radiographically   and then made a neck osteotomy with the femur on traction.  The femoral   head was removed without difficulty or complication.  Traction was let   off and retractors were placed posterior and anterior around the   acetabulum.      The labrum and foveal tissue were debrided.  I began reaming with a 62mm   reamer and reamed up to 58mm reamer with good bony bed preparation and a 34mm   cup was chosen.  The final 33mm Pinnacle  cup was then impacted under fluoroscopy  to confirm the depth of penetration and orientation with respect to   abduction.  A screw was placed followed by the hole eliminator.  The final   32+4 neutral Altrex liner was impacted with good visualized rim fit.  The cup was positioned anatomically within the acetabular portion of the pelvis.      At this point, the femur was rolled at 80 degrees.  Further capsule was   released off the inferior aspect of the femoral neck.  I then   released the superior capsule proximally.  The hook was placed laterally   along the femur and elevated manually and held in position with the bed   hook.  The leg was then extended and adducted with the leg rolled to 100   degrees of external rotation.  Once the proximal femur was fully   exposed, I used a box osteotome to set orientation.  I then began   broaching with the starting chili pepper broach and passed this by hand and then broached up to 2.  With the 2 broach in place I chose a high offset neck and did trial reductions.  The offset was appropriate, leg lengths   appeared to be equal best matched with the +1 head ball confirmed radiographically.   Given these findings, I went ahead and dislocated the hip, repositioned all   retractors and positioned the right hip in the extended and abducted position.  The final 2 hi Tri Lock stem was   chosen and it was impacted down to the level of neck cut.  Based on this   and the trial reduction, a 32+1 delta ceramic ball was chosen and   impacted onto a clean and dry trunnion, and the hip was reduced.  The   hip had been irrigated throughout the case again at this point.  I did   reapproximate the superior capsular leaflet to the anterior leaflet   using #1 Vicryl.  The fascia of the   tensor fascia lata muscle was then reapproximated using #1 Vicryl and #0 Stratafix sutures.  The   remaining wound was closed with 2-0 Vicryl and running 4-0 Monocryl.   The hip was  cleaned, dried,  and dressed sterilely using Dermabond and   Aquacel dressing.  She was then brought   to recovery room in stable condition tolerating the procedure well.    Nehemiah Massed, PA-C was present for the entirety of the case involved from   preoperative positioning, perioperative retractor management, general   facilitation of the case, as well as primary wound closure as assistant.            Pietro Cassis Alvan Dame, M.D.        03/11/2017 9:45 AM

## 2017-03-11 NOTE — Discharge Instructions (Signed)

## 2017-03-11 NOTE — Interval H&P Note (Signed)
History and Physical Interval Note:  03/11/2017 6:49 AM  Ashley Williams  has presented today for surgery, with the diagnosis of Right hip osteoarthritis  The various methods of treatment have been discussed with the patient and family. After consideration of risks, benefits and other options for treatment, the patient has consented to  Procedure(s) with comments: RIGHT TOTAL HIP ARTHROPLASTY ANTERIOR APPROACH (Right) - 70 mins as a surgical intervention .  The patient's history has been reviewed, patient examined, no change in status, stable for surgery.  I have reviewed the patient's chart and labs.  Questions were answered to the patient's satisfaction.     Mauri Pole

## 2017-03-12 LAB — BASIC METABOLIC PANEL
ANION GAP: 5 (ref 5–15)
BUN: 12 mg/dL (ref 6–20)
CALCIUM: 8 mg/dL — AB (ref 8.9–10.3)
CO2: 25 mmol/L (ref 22–32)
Chloride: 94 mmol/L — ABNORMAL LOW (ref 101–111)
Creatinine, Ser: 0.74 mg/dL (ref 0.44–1.00)
GLUCOSE: 98 mg/dL (ref 65–99)
POTASSIUM: 4.5 mmol/L (ref 3.5–5.1)
Sodium: 124 mmol/L — ABNORMAL LOW (ref 135–145)

## 2017-03-12 LAB — CBC
HCT: 21 % — ABNORMAL LOW (ref 36.0–46.0)
Hemoglobin: 7.7 g/dL — ABNORMAL LOW (ref 12.0–15.0)
MCH: 34.1 pg — ABNORMAL HIGH (ref 26.0–34.0)
MCHC: 36.7 g/dL — ABNORMAL HIGH (ref 30.0–36.0)
MCV: 92.9 fL (ref 78.0–100.0)
PLATELETS: 190 10*3/uL (ref 150–400)
RBC: 2.26 MIL/uL — AB (ref 3.87–5.11)
RDW: 12.8 % (ref 11.5–15.5)
WBC: 7 10*3/uL (ref 4.0–10.5)

## 2017-03-12 NOTE — Evaluation (Signed)
Physical Therapy Evaluation Patient Details Name: Ashley Williams MRN: [id number] DOB: [birth year] Today's Date: 03/12/2017   History of Present Illness  62 yo female s/p R THA-direct anterior 03/12/17.   Clinical Impression  On eval, pt required Min assist for mobility. She walked ~40 feet with a RW. Pain rated 8/10 with activity. Will follow and progress activity as tolerated. Plan is for possible d/c home on tomorrow.     Follow Up Recommendations DC plan and follow up therapy as arranged by surgeon    Equipment Recommendations  Rolling walker with 5" wheels;3in1 (PT)    Recommendations for Other Services       Precautions / Restrictions Precautions Precautions: Fall Restrictions Weight Bearing Restrictions: No      Mobility  Bed Mobility Overal bed mobility: Needs Assistance Bed Mobility: Supine to Sit       Sit to supine: Min assist   General bed mobility comments: assist for RLE  Transfers Overall transfer level: Needs assistance Equipment used: Rolling walker (2 wheeled) Transfers: Sit to/from Stand Sit to Stand: Min assist         General transfer comment: Assist to steady. VCs hand placement.   Ambulation/Gait Ambulation/Gait assistance: Min assist Ambulation Distance (Feet): 40 Feet Assistive device: Rolling walker (2 wheeled) Gait Pattern/deviations: Step-to pattern;Antalgic     General Gait Details: Intermittent assist to steady. VCs safety, sequence, proper use of RW. Slow gait speed. Pt c/o mild lightheadedness.   Stairs            Wheelchair Mobility    Modified Rankin (Stroke Patients Only)       Balance                                             Pertinent Vitals/Pain Pain Assessment: 0-10 Pain Score: 8  Pain Location: R hip/thigh Pain Descriptors / Indicators: Sore Pain Intervention(s): Limited activity within patient's tolerance;Repositioned;Ice applied    Home Living Family/patient expects to be  discharged to:: Private residence Living Arrangements: Spouse/significant other Available Help at Discharge: Family Type of Home: House       Home Layout: One level Home Equipment: None      Prior Function Level of Independence: Independent               Hand Dominance        Extremity/Trunk Assessment   Upper Extremity Assessment Upper Extremity Assessment: Defer to OT evaluation    Lower Extremity Assessment Lower Extremity Assessment: Generalized weakness    Cervical / Trunk Assessment Cervical / Trunk Assessment: Normal  Communication   Communication: No difficulties  Cognition Arousal/Alertness: Awake/alert Behavior During Therapy: WFL for tasks assessed/performed Overall Cognitive Status: Within Functional Limits for tasks assessed                                        General Comments      Exercises Total Joint Exercises Ankle Circles/Pumps: AROM;Both;10 reps;Supine Quad Sets: AROM;Both;10 reps;Supine Heel Slides: AAROM;Right;10 reps;Supine Hip ABduction/ADduction: AAROM;Right;10 reps;Supine   Assessment/Plan    PT Assessment Patient needs continued PT services  PT Problem List Decreased strength;Decreased range of motion;Decreased balance;Decreased mobility;Pain;Decreased activity tolerance;Decreased knowledge of use of DME       PT Treatment Interventions DME instruction;Gait training;Functional mobility training;Therapeutic activities;Balance  training;Patient/family education;Stair training;Therapeutic exercise    PT Goals (Current goals can be found in the Care Plan section)  Acute Rehab PT Goals Patient Stated Goal: home, 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 return to yoga PT Goal Formulation: With patient/family Time For Goal Achievement: 03/26/17 Potential to Achieve Goals: Good    Frequency 7X/week   Barriers to discharge        Co-evaluation               AM-PAC PT "6 Clicks"  Daily Activity  Outcome Measure Difficulty turning over in bed (including adjusting bedclothes, sheets and blankets)?: A Lot Difficulty moving from lying on back to sitting on the side of the bed? : Unable Difficulty sitting down on and standing up from a chair with arms (e.g., wheelchair, bedside commode, etc,.)?: A Little Help needed moving to and from a bed to chair (including a wheelchair)?: A Little Help needed walking in hospital room?: A Little Help needed climbing 3-5 steps with a railing? : A Lot 6 Click Score: 14    End of Session Equipment Utilized During Treatment: Gait belt Activity Tolerance: Patient tolerated treatment well Patient left: in bed;with call bell/phone within reach;with family/visitor present   PT Visit Diagnosis: Muscle weakness (generalized) (M62.81);Difficulty in walking, not elsewhere classified (R26.2);Pain Pain - Right/Left: Right Pain - part of body: Hip    Time: 5003-7048 PT Time Calculation (min) (ACUTE ONLY): 18 min   Charges:   PT Evaluation $PT Eval Low Complexity: 1 Low     PT G Codes:          Weston Anna, MPT Pager: [phone]

## 2017-03-12 NOTE — Progress Notes (Signed)
     Subjective: 1 Day Post-Op Procedure(s) (LRB): RIGHT TOTAL HIP ARTHROPLASTY ANTERIOR APPROACH (Right)   Patient reports pain as mild, pain controlled. Patient's BP has been soft since surgery with only 300cc of blood loss.  Discussed getting up with PT and expectations. Discussed keeping her tonight to make sure she does well with PT and see that she will be safe to go home.  Also to follow her vitals and labs to see that she is stable especially with BP and H&H.  Objective:   VITALS:   Vitals:   03/12/17 0510 03/12/17 0600  BP: (!) 81/44 (!) 91/56  Pulse: 74 63  Resp: 14   Temp: 98.7 F (37.1 C)   SpO2: 97%     Dorsiflexion/Plantar flexion intact Incision: dressing C/D/I No cellulitis present Compartment soft  LABS Recent Labs    03/12/17 0604  HGB 7.7*  HCT 21.0*  WBC 7.0  PLT 190    Recent Labs    03/12/17 0604  NA 124*  K 4.5  BUN 12  CREATININE 0.74  GLUCOSE 98     Assessment/Plan: 1 Day Post-Op Procedure(s) (LRB): RIGHT TOTAL HIP ARTHROPLASTY ANTERIOR APPROACH (Right) Foley cath d/c'ed Advance diet Up with therapy D/C IV fluids Discharge home eventually when ready      West Pugh. Hanah Moultry   PAC  03/12/2017, 8:21 AM

## 2017-03-12 NOTE — Progress Notes (Signed)
Physical Therapy Treatment Patient Details Name: Ashley Williams MRN: [id number] DOB: 1955/03/03 Today's Date: 03/12/2017    History of Present Illness 62 yo female s/p R THA-direct anterior 03/12/17.     PT Comments    Progressing with mobility. No c/o lightheadedness this session.    Follow Up Recommendations  DC plan and follow up therapy as arranged by surgeon     Equipment Recommendations  Rolling walker with 5" wheels;3in1 (PT)    Recommendations for Other Services       Precautions / Restrictions Precautions Precautions: Fall Restrictions Weight Bearing Restrictions: No RLE Weight Bearing: Weight bearing as tolerated    Mobility  Bed Mobility Overal bed mobility: Needs Assistance Bed Mobility: Supine to Sit;Sit to Supine     Supine to sit: Min assist Sit to supine: Min assist   General bed mobility comments: assist for RLE  Transfers Overall transfer level: Needs assistance Equipment used: Rolling walker (2 wheeled) Transfers: Sit to/from Stand Sit to Stand: Min guard         General transfer comment: close guard for safety. VCs hand placement.   Ambulation/Gait Ambulation/Gait assistance: Min guard Ambulation Distance (Feet): 75 Feet Assistive device: Rolling walker (2 wheeled) Gait Pattern/deviations: Step-to pattern     General Gait Details: Close guard for safety. VCs safety, sequence, proper use of RW. Slow gait speed.    Stairs            Wheelchair Mobility    Modified Rankin (Stroke Patients Only)       Balance                                            Cognition Arousal/Alertness: Awake/alert Behavior During Therapy: WFL for tasks assessed/performed Overall Cognitive Status: Within Functional Limits for tasks assessed                                        Exercises    General Comments        Pertinent Vitals/Pain Pain Assessment: 0-10 Pain Score: 6  Pain Location: R  hip/thigh Pain Descriptors / Indicators: Sore Pain Intervention(s): Monitored during session;Repositioned;Ice applied    Home Living Family/patient expects to be discharged to:: Private residence Living Arrangements: Spouse/significant other Available Help at Discharge: Family Type of Home: House     Home Layout: One level Home Equipment: None      Prior Function Level of Independence: Independent          PT Goals (current goals can now be found in the care plan section) Acute Rehab PT Goals Patient Stated Goal: home, 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 return to yoga PT Goal Formulation: With patient/family Time For Goal Achievement: 03/26/17 Potential to Achieve Goals: Good Progress towards PT goals: Progressing toward goals    Frequency    7X/week      PT Plan Current plan remains appropriate    Co-evaluation              AM-PAC PT "6 Clicks" Daily Activity  Outcome Measure  Difficulty turning over in bed (including adjusting bedclothes, sheets and blankets)?: A Lot Difficulty moving from lying on back to sitting on the side of the bed? : Unable Difficulty sitting down on and standing up from a  chair with arms (e.g., wheelchair, bedside commode, etc,.)?: A Little Help needed moving to and from a bed to chair (including a wheelchair)?: A Little Help needed walking in hospital room?: A Little Help needed climbing 3-5 steps with a railing? : A Little 6 Click Score: 15    End of Session Equipment Utilized During Treatment: Gait belt Activity Tolerance: Patient tolerated treatment well Patient left: in bed;with call bell/phone within reach;with family/visitor present   PT Visit Diagnosis: Muscle weakness (generalized) (M62.81);Difficulty in walking, not elsewhere classified (R26.2);Pain Pain - Right/Left: Right Pain - part of body: Hip     Time: 8979-1504 PT Time Calculation (min) (ACUTE ONLY): 16 min  Charges:  $Gait  Training: 8-22 mins                    G Codes:          Weston Anna, MPT Pager: [phone]

## 2017-03-12 NOTE — Evaluation (Signed)
Occupational Therapy Evaluation Patient Details Name: Ashley Williams MRN: [id number] DOB: 1955-12-07 Today's Date: 03/12/2017    History of Present Illness s/p R DA THA   Clinical Impression   This 62 year old female was admitted for the above sx. Evaluation limited by "wozziness".  BP WFLs lying down; Hgb 7.7 today.  Will follow in acute for further educate on bathroom transfers    Follow Up Recommendations  Supervision/Assistance - 24 hour    Equipment Recommendations  3 in 1 bedside commode    Recommendations for Other Services       Precautions / Restrictions Precautions Precautions: Fall Restrictions Weight Bearing Restrictions: No      Mobility Bed Mobility Overal bed mobility: Needs Assistance Bed Mobility: Sit to Supine       Sit to supine: Min assist   General bed mobility comments: assist for RLE  Transfers Overall transfer level: Needs assistance Equipment used: Rolling walker (2 wheeled) Transfers: Sit to/from [facility] Sit to Stand: Min guard;Min assist         General transfer comment: steadying assistance during SPT    Balance                                           ADL either performed or assessed with clinical judgement   ADL Overall ADL's : Needs assistance/impaired Eating/Feeding: Independent   Grooming: Set up;Sitting   Upper Body Bathing: Set up;Sitting   Lower Body Bathing: Moderate assistance;Sit to/from stand   Upper Body Dressing : Set up;Sitting   Lower Body Dressing: Maximal assistance;Sit to/from stand   Toilet Transfer: Stand-pivot;BSC;RW;Minimal assistance(steadying assistance)   Toileting- Water quality scientist and Hygiene: Min guard;Sit to/from stand         General ADL Comments: performed ADL from EOB after getting off commode.  c/o wozziness: lying BP wfls--see vitals section of chart.  Educated on AE options     Vision         Perception     Praxis       Pertinent Vitals/Pain Pain Assessment: Faces Faces Pain Scale: Hurts a little bit Pain Location: R hip Pain Descriptors / Indicators: Sore Pain Intervention(s): Limited activity within patient's tolerance;Monitored during session;Premedicated before session;Repositioned;Ice applied     Hand Dominance     Extremity/Trunk Assessment Upper Extremity Assessment Upper Extremity Assessment: LUE deficits/detail(sore shoulder; limited ROM)           Communication Communication Communication: No difficulties   Cognition Arousal/Alertness: Awake/alert Behavior During Therapy: WFL for tasks assessed/performed Overall Cognitive Status: Within Functional Limits for tasks assessed                                     General Comments       Exercises     Shoulder Instructions      Home Living Family/patient expects to be discharged to:: Private residence Living Arrangements: Spouse/significant other                 Bathroom Shower/Tub: Occupational psychologist: Standard                Prior Functioning/Environment Level of Independence: Independent                 OT Problem List: Decreased activity tolerance;Pain;Decreased  knowledge of use of DME or AE      OT Treatment/Interventions: Self-care/ADL training;DME and/or AE instruction;Patient/family education    OT Goals(Current goals can be found in the care plan section) Acute Rehab OT Goals Patient Stated Goal: home, return to independence and 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 OT Goal Formulation: With patient Time For Goal Achievement: 03/26/17 Potential to Achieve Goals: Good ADL Goals Pt Will Transfer to Toilet: with supervision;ambulating;bedside commode Pt Will Perform Tub/Shower Transfer: Shower transfer;with min guard assist;3 in 1;ambulating Additional ADL Goal #1: pt will perform bed mobility at supervision level in preparation for adls  OT Frequency: Min 2X/week    Barriers to D/C:            Co-evaluation              AM-PAC PT "6 Clicks" Daily Activity     Outcome Measure Help from another person eating meals?: None Help from another person taking care of personal grooming?: A Little Help from another person toileting, which includes using toliet, bedpan, or urinal?: A Little Help from another person bathing (including washing, rinsing, drying)?: A Lot Help from another person to put on and taking off regular upper body clothing?: A Little Help from another person to put on and taking off regular lower body clothing?: A Lot 6 Click Score: 17   End of Session    Activity Tolerance: (wozziness) Patient left: in bed;with call bell/phone within reach;with family/visitor present  OT Visit Diagnosis: Pain Pain - Right/Left: Right Pain - part of body: Hip                Time: 1610-9604 OT Time Calculation (min): 23 min Charges:  OT General Charges $OT Visit: 1 Visit OT Evaluation $OT Eval Low Complexity: 1 Low G-Codes:     [place], OTR/L [phone] 03/12/2017  Lars Jeziorski 03/12/2017, 11:52 AM

## 2017-03-13 LAB — CBC
HCT: 21.7 % — ABNORMAL LOW (ref 36.0–46.0)
HEMOGLOBIN: 7.9 g/dL — AB (ref 12.0–15.0)
MCH: 34.2 pg — AB (ref 26.0–34.0)
MCHC: 36.4 g/dL — ABNORMAL HIGH (ref 30.0–36.0)
MCV: 93.9 fL (ref 78.0–100.0)
PLATELETS: 218 10*3/uL (ref 150–400)
RBC: 2.31 MIL/uL — AB (ref 3.87–5.11)
RDW: 13.2 % (ref 11.5–15.5)
WBC: 8.6 10*3/uL (ref 4.0–10.5)

## 2017-03-13 LAB — BASIC METABOLIC PANEL
Anion gap: 6 (ref 5–15)
BUN: 13 mg/dL (ref 6–20)
CO2: 27 mmol/L (ref 22–32)
CREATININE: 0.71 mg/dL (ref 0.44–1.00)
Calcium: 8.7 mg/dL — ABNORMAL LOW (ref 8.9–10.3)
Chloride: 100 mmol/L — ABNORMAL LOW (ref 101–111)
GFR calc Af Amer: >60 mL/min (ref >60)
Glucose, Bld: 97 mg/dL (ref 65–99)
POTASSIUM: 4.3 mmol/L (ref 3.5–5.1)
SODIUM: 133 mmol/L — AB (ref 135–145)

## 2017-03-13 NOTE — Progress Notes (Signed)
Physical Therapy Treatment Patient Details Name: Ashley Williams MRN: [id number] DOB: 1955/07/04 Today's Date: 03/13/2017    History of Present Illness 62 yo female s/p R THA-direct anterior 03/12/17.     PT Comments    Pt was able to walk ~75 feet and practice stair negotiation. After stair training, pt c/o feeling "woozy" and nauseous. Assessed BP: 112/52 while sitting EOB. Pt continued to c/o not feeling well so deferred any further activity and assisted pt back to supine. Will plan to have a 2nd session to continue education and assess pt's activity tolerance prior to possible d/c.    Follow Up Recommendations  DC plan and follow up therapy as arranged by surgeon     Equipment Recommendations  Rolling walker with 5" wheels;3in1 (PT)    Recommendations for Other Services       Precautions / Restrictions Precautions Precautions: Fall Restrictions Weight Bearing Restrictions: No RLE Weight Bearing: Weight bearing as tolerated    Mobility  Bed Mobility Overal bed mobility: Needs Assistance Bed Mobility: Sit to Supine       Sit to supine: Min assist   General bed mobility comments: assist for RLE  Transfers Overall transfer level: Needs assistance Equipment used: Rolling walker (2 wheeled) Transfers: Sit to/from Stand Sit to Stand: Min guard         General transfer comment: close guard for safety. VCs hand placement.   Ambulation/Gait Ambulation/Gait assistance: Min guard Ambulation Distance (Feet): 75 Feet Assistive device: Rolling walker (2 wheeled) Gait Pattern/deviations: Step-to pattern     General Gait Details: Close guard for safety. VCs safety, sequence, proper use of RW. Slow gait speed.    Stairs Stairs: Yes  Min guard assist Stair Management: Step to pattern;Forwards;With walker Number of Stairs: 1 General stair comments: close guard for safety. VCs safety, technique, sequence.  Pt c/o dizziness after stair practice so quickly returned to  room  Wheelchair Mobility    Modified Rankin (Stroke Patients Only)       Balance                                            Cognition Arousal/Alertness: Awake/alert Behavior During Therapy: WFL for tasks assessed/performed Overall Cognitive Status: Within Functional Limits for tasks assessed                                        Exercises      General Comments        Pertinent Vitals/Pain Pain Assessment: 0-10 Pain Score: 5  Pain Location: R hip/thigh Pain Descriptors / Indicators: Sore Pain Intervention(s): Limited activity within patient's tolerance;Monitored during session;Repositioned    Home Living                      Prior Function            PT Goals (current goals can now be found in the care plan section) Progress towards PT goals: Progressing toward goals    Frequency    7X/week      PT Plan Current plan remains appropriate    Co-evaluation              AM-PAC PT "6 Clicks" Daily Activity  Outcome Measure  Difficulty turning over in bed (including  adjusting bedclothes, sheets and blankets)?: A Lot Difficulty moving from lying on back to sitting on the side of the bed? : Unable Difficulty sitting down on and standing up from a chair with arms (e.g., wheelchair, bedside commode, etc,.)?: A Little Help needed moving to and from a bed to chair (including a wheelchair)?: A Little Help needed walking in hospital room?: A Little Help needed climbing 3-5 steps with a railing? : A Little 6 Click Score: 15    End of Session Equipment Utilized During Treatment: Gait belt Activity Tolerance: (limited by dizzness, nausea) Patient left: in bed;with call bell/phone within reach;with family/visitor present;with nursing/sitter in room   PT Visit Diagnosis: Muscle weakness (generalized) (M62.81);Difficulty in walking, not elsewhere classified (R26.2);Pain Pain - Right/Left: Right Pain - part of body:  Hip     Time: 0911-0926 PT Time Calculation (min) (ACUTE ONLY): 15 min  Charges:  $Gait Training: 8-22 mins                    G Codes:          Weston Anna, MPT Pager: [phone]

## 2017-03-13 NOTE — Progress Notes (Signed)
Occupational Therapy Treatment Patient Details Name: Ashley Williams MRN: [id number] DOB: 1955-12-06 Today's Date: 03/13/2017    History of present illness 62 yo female s/p R THA-direct anterior 03/12/17.    OT comments  Pt and spouse instructed in safe technique for shower transfer and use of 3in1 as a shower seat.  She was able to return demonstration.  Spouse very supportive.  She reports performing LB ADLs with min A this am.  She is eager for discharge.   Follow Up Recommendations  Supervision/Assistance - 24 hour    Equipment Recommendations  3 in 1 bedside commode    Recommendations for Other Services      Precautions / Restrictions Precautions Precautions: Fall Restrictions Weight Bearing Restrictions: No RLE Weight Bearing: Weight bearing as tolerated       Mobility Bed Mobility Overal bed mobility: Needs Assistance Bed Mobility: Supine to Sit     Supine to sit: Min assist     General bed mobility comments: assist for Rt LE   Transfers Overall transfer level: Needs assistance Equipment used: Rolling walker (2 wheeled) Transfers: Sit to/from Stand Sit to Stand: Min guard         General transfer comment: min guard for safety     Balance                                           ADL either performed or assessed with clinical judgement   ADL Overall ADL's : Needs assistance/impaired                                 Tub/ Shower Transfer: Walk-in shower;Min guard;3 in 1;Rolling walker;Ambulation Tub/Shower Transfer Details (indicate cue type and reason): instructed on safe transfer, where to position 3in1, use of hand held shower.  She and spouse verbalized understanding  Functional mobility during ADLs: Min guard;Rolling walker General ADL Comments: recommeded use of walker bag for safety      Vision       Perception     Praxis      Cognition Arousal/Alertness: Awake/alert Behavior During Therapy: WFL for tasks  assessed/performed Overall Cognitive Status: Within Functional Limits for tasks assessed                                          Exercises     Shoulder Instructions       General Comments      Pertinent Vitals/ Pain       Pain Assessment: Faces Faces Pain Scale: Hurts little more Pain Location: R hip/thigh Pain Descriptors / Indicators: Pressure;Spasm Pain Intervention(s): Monitored during session  Home Living                                          Prior Functioning/Environment              Frequency  Min 2X/week        Progress Toward Goals  OT Goals(current goals can now be found in the care plan section)  Progress towards OT goals: Progressing toward goals     Plan Discharge plan remains appropriate  Co-evaluation                 AM-PAC PT "6 Clicks" Daily Activity     Outcome Measure   Help from another person eating meals?: None Help from another person taking care of personal grooming?: A Little Help from another person toileting, which includes using toliet, bedpan, or urinal?: A Little Help from another person bathing (including washing, rinsing, drying)?: A Little Help from another person to put on and taking off regular upper body clothing?: A Little Help from another person to put on and taking off regular lower body clothing?: A Little 6 Click Score: 19    End of Session Equipment Utilized During Treatment: Rolling walker  OT Visit Diagnosis: Pain Pain - Right/Left: Right Pain - part of body: Hip   Activity Tolerance Patient tolerated treatment well   Patient Left in bed;with call bell/phone within reach;with family/visitor present   Nurse Communication Mobility status        Time: 4859-2763 OT Time Calculation (min): 21 min  Charges: OT General Charges $OT Visit: 1 Visit OT Treatments $Self Care/Home Management : 8-22 mins  [facility], OTR/L [phone]    Lucille Passy  M 03/13/2017, 8:24 PM

## 2017-03-13 NOTE — Progress Notes (Signed)
Ashley Williams to be D/C'd Home per MD order. Discussed with the patient and all questions fully answered.    VVS, Skin clean, dry and intact without evidence of skin break down, no evidence of skin tears noted.  IV catheter discontinued intact. Site without signs and symptoms of complications. Dressing and pressure applied.  An After Visit Summary was printed and given to the patient.  Patient escorted via [place], and D/C home via private auto.  Cyndra Numbers  03/13/2017 1:30 PM

## 2017-03-13 NOTE — Progress Notes (Signed)
Physical Therapy Treatment Patient Details Name: BRITAIN ANAGNOS MRN: [id number] DOB: 12/08/1955 Today's Date: 03/13/2017    History of Present Illness 62 yo female s/p R THA-direct anterior 03/12/17.     PT Comments    2nd session since pt felt dizzy and nauseous this am. Reviewed/practiced exercises and gait training. Pt tearful during session. Requested pain meds for her. Also informed front desk that pt stated she was waiting on a RW and 3n1. Issued HEP for pt to perform 2x/day until she follows up with surgeon. All education completed. Okay to d/c from PT standpoint-made front desk aware.    Follow Up Recommendations  DC plan and follow up therapy as arranged by surgeon     Equipment Recommendations  Rolling walker with 5" wheels;3in1 (PT)    Recommendations for Other Services       Precautions / Restrictions Precautions Precautions: Fall Restrictions Weight Bearing Restrictions: No RLE Weight Bearing: Weight bearing as tolerated    Mobility  Bed Mobility Overal bed mobility: Needs Assistance Bed Mobility: Supine to Sit     Supine to sit: Min assist    General bed mobility comments: small amount of assist for R LE  Transfers Overall transfer level: Needs assistance Equipment used: Rolling walker (2 wheeled) Transfers: Sit to/from Stand Sit to Stand: Min guard         General transfer comment: close guard for safety. VCs hand placement.   Ambulation/Gait Ambulation/Gait assistance: Min guard Ambulation Distance (Feet): 50 Feet Assistive device: Rolling walker (2 wheeled) Gait Pattern/deviations: Step-to pattern     General Gait Details: Close guard for safety. VCs safety, sequence, proper use of RW. Slow gait speed.    Stairs  Wheelchair Mobility    Modified Rankin (Stroke Patients Only)       Balance                                            Cognition Arousal/Alertness: Awake/alert Behavior During Therapy: WFL for tasks  assessed/performed Overall Cognitive Status: Within Functional Limits for tasks assessed                                        Exercises Total Joint Exercises Hip ABduction/ADduction: AROM;Right;10 reps;Standing Long Arc Quad: AROM;Right;10 reps;Seated Knee Flexion: AROM;Right;10 reps;Standing Marching in Standing: AROM;Both;10 reps;Standing General Exercises - Lower Extremity Heel Raises: AROM;Both;10 reps;Standing    General Comments        Pertinent Vitals/Pain Pain Assessment: 0-10 Pain Score: 7  Pain Location: R hip/thigh Pain Descriptors / Indicators: Crying;Sore Pain Intervention(s): Monitored during session;Patient requesting pain meds-RN notified    Home Living                      Prior Function            PT Goals (current goals can now be found in the care plan section) Progress towards PT goals: Progressing toward goals    Frequency    7X/week      PT Plan Current plan remains appropriate    Co-evaluation              AM-PAC PT "6 Clicks" Daily Activity  Outcome Measure  Difficulty turning over in bed (including adjusting bedclothes, sheets and blankets)?: A Lot  Difficulty moving from lying on back to sitting on the side of the bed? : Unable Difficulty sitting down on and standing up from a chair with arms (e.g., wheelchair, bedside commode, etc,.)?: A Little Help needed moving to and from a bed to chair (including a wheelchair)?: A Little Help needed walking in hospital room?: A Little Help needed climbing 3-5 steps with a railing? : A Little 6 Click Score: 15    End of Session Equipment Utilized During Treatment: Gait belt Activity Tolerance: Patient tolerated treatment well(some increased pain with exercises-requested pain meds) Patient left: in bed;with call bell/phone within reach;with family/visitor present   PT Visit Diagnosis: Muscle weakness (generalized) (M62.81);Difficulty in walking, not elsewhere  classified (R26.2);Pain Pain - Right/Left: Right Pain - part of body: Hip     Time: 2481-8590 PT Time Calculation (min) (ACUTE ONLY): 10 min  Charges:  $Gait Training: 8-22 mins                    G Codes:          Weston Anna, MPT Pager: [phone]

## 2017-03-13 NOTE — Progress Notes (Signed)
     Subjective: 2 Days Post-Op Procedure(s) (LRB): RIGHT TOTAL HIP ARTHROPLASTY ANTERIOR APPROACH (Right)   Patient reports pain as mild, pain controlled. No events throughout the night.  Feeling much better today.  Discussed the use of iron at home to help her H&H.  Also discussed constipation prevention.  Ready to be discharged home.   Objective:   VITALS:   Vitals:   03/12/17 2229 03/13/17 0627  BP: 107/64 111/66  Pulse: 68 73  Resp: 17 17  Temp: 98.4 F (36.9 C) 98.2 F (36.8 C)  SpO2: 97% 95%    Dorsiflexion/Plantar flexion intact Incision: dressing C/D/I No cellulitis present Compartment soft  LABS Recent Labs    03/12/17 0604 03/13/17 0605  HGB 7.7* 7.9*  HCT 21.0* 21.7*  WBC 7.0 8.6  PLT 190 218    Recent Labs    03/12/17 0604 03/13/17 0605  NA 124* 133*  K 4.5 4.3  BUN 12 13  CREATININE 0.74 0.71  GLUCOSE 98 97     Assessment/Plan: 2 Days Post-Op Procedure(s) (LRB): RIGHT TOTAL HIP ARTHROPLASTY ANTERIOR APPROACH (Right) Up with therapy Discharge home Follow up in 2 weeks at [facility]. Follow up with OLIN,Ariannah Arenson D in 2 weeks.  Contact information:  [facility] [address] [place]   PAC  03/13/2017, 8:19 AM

## 2017-03-14 NOTE — Discharge Summary (Signed)
Physician Discharge Summary  Patient ID: Ashley Williams MRN: [id number] DOB/AGE: 04-24-1955 62 y.o.  Admit date: 03/11/2017 Discharge date: 03/13/2017   Procedures:  Procedure(s) (LRB): RIGHT TOTAL HIP ARTHROPLASTY ANTERIOR APPROACH (Right)  Attending Physician:  Dr. Paralee Cancel   Admission Diagnoses:   Right hip primary OA / pain  Discharge Diagnoses:  Principal Problem:   S/P right THA, AA Active Problems:   S/P hip replacement  Past Medical History:  Diagnosis Date  . Arthritis   . Complication of anesthesia    1979- blood pressure drop during surgery     HPI:    Ashley Williams, 62 y.o. female, has a history of pain and functional disability in the right hip(s) due to arthritis and patient has failed non-surgical conservative treatments for greater than 12 weeks to include NSAID's and/or analgesics and activity modification.  Onset of symptoms was gradual starting 4+ years ago with gradually worsening course since that time.The patient noted no past surgery on the right hip(s).  Patient currently rates pain in the right hip at 9 out of 10 with activity. Patient has night pain, worsening of pain with activity and weight bearing, trendelenberg gait, pain that interfers with activities of daily living and pain with passive range of motion. Patient has evidence of periarticular osteophytes and joint space narrowing by imaging studies. This condition presents safety issues increasing the risk of falls.  There is no current active infection.  Risks, benefits and expectations were discussed with the patient.  Risks including but not limited to the risk of anesthesia, blood clots, nerve damage, blood vessel damage, failure of the prosthesis, infection and up to and including death.  Patient understand the risks, benefits and expectations and wishes to proceed with surgery.   PCP: Ashley Kern, DO   Discharged Condition: good  Hospital Course:  Patient underwent the above stated procedure  on 03/11/2017. Patient tolerated the procedure well and brought to the recovery room in good condition and subsequently to the floor.  POD #1 BP: 91/56 ; Pulse: 63 ; Temp: 98.7 F (37.1 C) ; Resp: 14 Patient reports pain as mild, pain controlled. Patient's BP has been soft since surgery with only 300cc of blood loss.  Discussed getting up with PT and expectations. Discussed keeping her tonight to make sure she does well with PT and see that she will be safe to go home.  Also to follow her vitals and labs to see that she is stable especially with BP and H&H. Dorsiflexion/plantar flexion intact, incision: dressing C/D/I, no cellulitis present and compartment soft.   LABS  Basename    HGB     7.7  HCT     21.0   POD #2  BP: 111/66 ; Pulse: 73 ; Temp: 98.2 F (36.8 C) ; Resp: 17 Patient reports pain as mild, pain controlled. No events throughout the night.  Feeling much better today.  Discussed the use of iron at home to help her H&H.  Also discussed constipation prevention.  Ready to be discharged home.  Dorsiflexion/plantar flexion intact, incision: dressing C/D/I, no cellulitis present and compartment soft.   LABS  Basename    HGB     7.9  HCT     21.7    Discharge Exam: General appearance: alert, cooperative and no distress Extremities: Homans sign is negative, no sign of DVT, no edema, redness or tenderness in the calves or thighs and no ulcers, gangrene or trophic changes  Disposition: Home with  follow up in 2 weeks   Follow-up Information    Paralee Cancel, MD. Schedule an appointment as soon as possible for a visit in 2 week(s).   Specialty:  Orthopedic Surgery Contact information: [address] [place] [address] [phone]           Discharge Instructions    Call MD / Call 911   Complete by:  As directed    If you experience chest pain or shortness of breath, CALL 911 and be transported to the hospital emergency room.  If you develope a fever  above 101 F, pus (white drainage) or increased drainage or redness at the wound, or calf pain, call your surgeon's office.   Change dressing   Complete by:  As directed    Maintain surgical dressing until follow up in the clinic. If the edges start to pull up, may reinforce with tape. If the dressing is no longer working, may remove and cover with gauze and tape, but must keep the area dry and clean.  Call with any questions or concerns.   Constipation Prevention   Complete by:  As directed    Drink plenty of fluids.  Prune juice may be helpful.  You may use a stool softener, such as Colace (over the counter) 100 mg twice a day.  Use MiraLax (over the counter) for constipation as needed.   Diet - low sodium heart healthy   Complete by:  As directed    Discharge instructions   Complete by:  As directed    Maintain surgical dressing until follow up in the clinic. If the edges start to pull up, may reinforce with tape. If the dressing is no longer working, may remove and cover with gauze and tape, but must keep the area dry and clean.  Follow up in 2 weeks at [facility]. Call with any questions or concerns.   Increase activity slowly as tolerated   Complete by:  As directed    Weight bearing as tolerated with assist device (walker, cane, etc) as directed, use it as long as suggested by your surgeon or therapist, typically at least 4-6 weeks.   TED hose   Complete by:  As directed    Use stockings (TED hose) for 2 weeks on both leg(s).  You may remove them at night for sleeping.      Allergies as of 03/13/2017   No Known Allergies     Medication List    STOP taking these medications   ibuprofen 200 MG tablet Commonly known as:  ADVIL,MOTRIN     TAKE these medications   aspirin 81 MG chewable tablet Commonly known as:  ASPIRIN CHILDRENS Chew 1 tablet (81 mg total) by mouth 2 (two) times daily. Take for 4 weeks, then resume regular dose.   docusate sodium 100 MG  capsule Commonly known as:  COLACE Take 1 capsule (100 mg total) by mouth 2 (two) times daily.   ferrous sulfate 325 (65 FE) MG tablet Commonly known as:  FERROUSUL Take 1 tablet (325 mg total) by mouth 3 (three) times daily with meals.   Fish Oil 1200 MG Caps Take 1,200 mg by mouth daily.   Flaxseed Oil 1200 MG Caps Take 1,200 mg by mouth daily.   HYDROcodone-acetaminophen 7.5-325 MG tablet Commonly known as:  NORCO Take 1-2 tablets by mouth every 4 (four) hours as needed for moderate pain.   MELATON-5 HTP-TRYPTOPHAN-B6-MG PO Take 1 tablet by mouth every other day. Essential  Sleep Dietary Supplement at bedtime.   methocarbamol 500 MG tablet Commonly known as:  ROBAXIN Take 1 tablet (500 mg total) by mouth every 6 (six) hours as needed for muscle spasms.   multivitamin with minerals Tabs tablet Take 1 tablet by mouth daily. DAILY ESSENTIALS   OSTEO BI-FLEX REGULAR STRENGTH PO Take 1 tablet by mouth daily.   OVER THE COUNTER MEDICATION Take 2 capsules by mouth 2 (two) times daily. MOOD ESSENTIALS.   polyethylene glycol packet Commonly known as:  MIRALAX / GLYCOLAX Take 17 g by mouth 2 (two) times daily.            Discharge Care Instructions  (From admission, onward)        Start     Ordered   03/12/17 0000  Change dressing    Comments:  Maintain surgical dressing until follow up in the clinic. If the edges start to pull up, may reinforce with tape. If the dressing is no longer working, may remove and cover with gauze and tape, but must keep the area dry and clean.  Call with any questions or concerns.   03/12/17 0830       Signed: West Pugh. Tacarra Justo   PA-C  03/14/2017, 1:01 PM

## 2017-03-26 ENCOUNTER — Encounter (HOSPITAL_COMMUNITY): Payer: Self-pay

## 2017-03-26 ENCOUNTER — Ambulatory Visit (HOSPITAL_COMMUNITY)
Admission: RE | Admit: 2017-03-26 | Discharge: 2017-03-26 | Disposition: A | Discharge status: Home / Self Care | Payer: BLUE CROSS/BLUE SHIELD | Source: Ambulatory Visit | Attending: Cardiovascular Disease | Admitting: Cardiovascular Disease

## 2017-03-26 ENCOUNTER — Other Ambulatory Visit (HOSPITAL_COMMUNITY): Payer: Self-pay | Admitting: Orthopedic Surgery

## 2017-03-26 DIAGNOSIS — Z471 Aftercare following joint replacement surgery: Secondary | ICD-10-CM | POA: Diagnosis not present

## 2017-03-26 DIAGNOSIS — M25561 Pain in right knee: Secondary | ICD-10-CM | POA: Diagnosis not present

## 2017-03-26 DIAGNOSIS — R52 Pain, unspecified: Secondary | ICD-10-CM

## 2017-03-26 DIAGNOSIS — M7989 Other specified soft tissue disorders: Secondary | ICD-10-CM | POA: Insufficient documentation

## 2017-03-26 DIAGNOSIS — R609 Edema, unspecified: Secondary | ICD-10-CM

## 2017-03-26 DIAGNOSIS — M79604 Pain in right leg: Secondary | ICD-10-CM | POA: Insufficient documentation

## 2017-03-26 DIAGNOSIS — M79661 Pain in right lower leg: Secondary | ICD-10-CM | POA: Diagnosis not present

## 2017-03-26 DIAGNOSIS — M79605 Pain in left leg: Secondary | ICD-10-CM | POA: Insufficient documentation

## 2017-03-26 DIAGNOSIS — Z96641 Presence of right artificial hip joint: Secondary | ICD-10-CM | POA: Diagnosis not present

## 2017-03-26 NOTE — Progress Notes (Unsigned)
Today's right lower extremity venous duplex is negative for DVT.  Preliminary results given to [facility] at 1:30 PM.

## 2017-04-23 DIAGNOSIS — Z471 Aftercare following joint replacement surgery: Secondary | ICD-10-CM | POA: Diagnosis not present

## 2017-04-23 DIAGNOSIS — Z96641 Presence of right artificial hip joint: Secondary | ICD-10-CM | POA: Diagnosis not present

## 2017-06-04 DIAGNOSIS — Z96641 Presence of right artificial hip joint: Secondary | ICD-10-CM | POA: Diagnosis not present

## 2017-06-04 DIAGNOSIS — Z471 Aftercare following joint replacement surgery: Secondary | ICD-10-CM | POA: Diagnosis not present

## 2017-06-04 DIAGNOSIS — M7542 Impingement syndrome of left shoulder: Secondary | ICD-10-CM | POA: Diagnosis not present

## 2017-06-24 ENCOUNTER — Encounter: Payer: Self-pay | Admitting: Gastroenterology

## 2017-07-23 DIAGNOSIS — M19012 Primary osteoarthritis, left shoulder: Secondary | ICD-10-CM | POA: Diagnosis not present

## 2017-07-23 DIAGNOSIS — M7542 Impingement syndrome of left shoulder: Secondary | ICD-10-CM | POA: Diagnosis not present

## 2017-08-27 DIAGNOSIS — H01119 Allergic dermatitis of unspecified eye, unspecified eyelid: Secondary | ICD-10-CM | POA: Diagnosis not present

## 2017-09-10 ENCOUNTER — Ambulatory Visit (AMBULATORY_SURGERY_CENTER): Payer: Self-pay

## 2017-09-10 VITALS — Ht 63.0 in | Wt 134.8 lb

## 2017-09-10 DIAGNOSIS — Z1211 Encounter for screening for malignant neoplasm of colon: Secondary | ICD-10-CM

## 2017-09-10 MED ORDER — NA SULFATE-K SULFATE-MG SULF 17.5-3.13-1.6 GM/177ML PO SOLN: 1.0000 | Freq: Once | ORAL | 0 refills | Status: AC

## 2017-09-10 NOTE — Progress Notes (Signed)
Per pt, no allergies to soy or egg products.Pt not taking any weight loss meds or using  O2 at home.  Pt refused emmi video. 

## 2017-09-16 ENCOUNTER — Encounter: Payer: Self-pay | Admitting: Gastroenterology

## 2017-09-25 ENCOUNTER — Encounter: Payer: Self-pay | Admitting: Gastroenterology

## 2017-09-25 ENCOUNTER — Ambulatory Visit (AMBULATORY_SURGERY_CENTER): Payer: BLUE CROSS/BLUE SHIELD | Admitting: Gastroenterology

## 2017-09-25 ENCOUNTER — Encounter: Payer: Self-pay | Admitting: *Deleted

## 2017-09-25 VITALS — BP 117/66 | HR 61 | Temp 98.0°F | Resp 12 | Ht 63.0 in | Wt 134.0 lb

## 2017-09-25 DIAGNOSIS — D125 Benign neoplasm of sigmoid colon: Secondary | ICD-10-CM | POA: Diagnosis not present

## 2017-09-25 DIAGNOSIS — Z1211 Encounter for screening for malignant neoplasm of colon: Secondary | ICD-10-CM

## 2017-09-25 DIAGNOSIS — K635 Polyp of colon: Secondary | ICD-10-CM | POA: Diagnosis not present

## 2017-09-25 MED ORDER — SODIUM CHLORIDE 0.9 % IV SOLN: 500.0000 mL | Freq: Once | INTRAVENOUS | Status: DC

## 2017-09-25 NOTE — Op Note (Signed)
[place] Patient Name: Ashley Williams Procedure Date: 09/25/2017 8:35 AM MRN: [id number] Endoscopist: Mauri Pole , MD Age: 62 Referring MD:  Date of Birth: 1955-08-17 Gender: Female Account #: [id number] Procedure:                Colonoscopy Indications:              Screening for colorectal malignant neoplasm Medicines:                Monitored Anesthesia Care Procedure:                Pre-Anesthesia Assessment:                           - Prior to the procedure, a History and Physical                            was performed, and patient medications and                            allergies were reviewed. The patient's tolerance of                            previous anesthesia was also reviewed. The risks                            and benefits of the procedure and the sedation                            options and risks were discussed with the patient.                            All questions were answered, and informed consent                            was obtained. Prior Anticoagulants: The patient has                            taken no previous anticoagulant or antiplatelet                            agents. ASA Grade Assessment: II - A patient with                            mild systemic disease. After reviewing the risks                            and benefits, the patient was deemed in                            satisfactory condition to undergo the procedure.                           After obtaining informed consent, the colonoscope  was passed under direct vision. Throughout the                            procedure, the patient's blood pressure, pulse, and                            oxygen saturations were monitored continuously. The                            Model PCF-[id number]) scope was introduced                            through the anus and advanced to the the splenic                            flexure for  evaluation. This was the intended                            extent. The colonoscopy was performed without                            difficulty. The patient tolerated the procedure                            well. The quality of the bowel preparation was                            poor. The rectum was photographed. Scope In: 8:37:15 AM Scope Out: 8:44:36 AM Total Procedure Duration: 0 hours 7 minutes 21 seconds  Findings:                 The perianal and digital rectal examinations were                            normal.                           A 12 mm polyp was found in the sigmoid colon. The                            polyp was sessile. The polyp was removed with a                            cold snare. Resection and retrieval were complete.                           Non-bleeding internal hemorrhoids were found during                            retroflexion. The hemorrhoids were small. Complications:            No immediate complications. Estimated Blood Loss:     Estimated blood loss was minimal. Impression:               - Preparation of the colon was  poor.                           - One 12 mm polyp in the sigmoid colon, removed                            with a cold snare. Resected and retrieved.                           - Non-bleeding internal hemorrhoids. Recommendation:           - Patient has a contact number available for                            emergencies. The signs and symptoms of potential                            delayed complications were discussed with the                            patient. Return to normal activities tomorrow.                            Written discharge instructions were provided to the                            patient.                           - Resume previous diet.                           - Continue present medications.                           - Await pathology results.                           - Repeat colonoscopy at appointment  to be scheduled                            because the bowel preparation was suboptimal. Mauri Pole, MD 09/25/2017 8:56:17 AM This report has been signed electronically.

## 2017-09-25 NOTE — Progress Notes (Signed)
A/ox3 pleased with MAC, report to RN 

## 2017-09-25 NOTE — Progress Notes (Signed)
Called to room to assist during endoscopic procedure.  Patient ID and intended procedure confirmed with present staff. Received instructions for my participation in the procedure from the performing physician.  

## 2017-09-25 NOTE — Patient Instructions (Signed)
YOU HAD AN ENDOSCOPIC PROCEDURE TODAY AT [facility]:   Refer to the procedure report that was given to you for any specific questions about what was found during the examination.  If the procedure report does not answer your questions, please call your gastroenterologist to clarify.  If you requested that your care partner not be given the details of your procedure findings, then the procedure report has been included in a sealed envelope for you to review at your convenience later.  YOU SHOULD EXPECT: Some feelings of bloating in the abdomen. Passage of more gas than usual.  Walking can help get rid of the air that was put into your GI tract during the procedure and reduce the bloating. If you had a lower endoscopy (such as a colonoscopy or flexible sigmoidoscopy) you may notice spotting of blood in your stool or on the toilet paper. If you underwent a bowel prep for your procedure, you may not have a normal bowel movement for a few days.  Please Note:  You might notice some irritation and congestion in your nose or some drainage.  This is from the oxygen used during your procedure.  There is no need for concern and it should clear up in a day or so.  SYMPTOMS TO REPORT IMMEDIATELY:   Following lower endoscopy (colonoscopy or flexible sigmoidoscopy):  Excessive amounts of blood in the stool  Significant tenderness or worsening of abdominal pains  Swelling of the abdomen that is new, acute  Fever of 100F or higher  PLease see handouts given to you on polyps.  Next colonoscopy is scheduled on 10/02/17 at 11:00. Please follow prep instructions given to you.  For urgent or emergent issues, a gastroenterologist can be reached at any hour by calling [phone].   DIET:  We do recommend a small meal at first, but then you may proceed to your regular diet.  Drink plenty of fluids but you should avoid alcoholic beverages for 24 hours.  ACTIVITY:  You should plan to take it easy  for the rest of today and you should NOT DRIVE or use heavy machinery until tomorrow (because of the sedation medicines used during the test).    FOLLOW UP: Our staff will call the number listed on your records the next business day following your procedure to check on you and address any questions or concerns that you may have regarding the information given to you following your procedure. If we do not reach you, we will leave a message.  However, if you are feeling well and you are not experiencing any problems, there is no need to return our call.  We will assume that you have returned to your regular daily activities without incident.  If any biopsies were taken you will be contacted by phone or by letter within the next 1-3 weeks.  Please call us at [phone] if you have not heard about the biopsies in 3 weeks.    SIGNATURES/CONFIDENTIALITY: You and/or your care partner have signed paperwork which will be entered into your electronic medical record.  These signatures attest to the fact that that the information above on your After Visit Summary has been reviewed and is understood.  Full responsibility of the confidentiality of this discharge information lies with you and/or your care-partner.  Thank you for letting us take care of your healthcare needs today.

## 2017-09-25 NOTE — Progress Notes (Signed)
Pt's states no medical or surgical changes since previsit or office visit. 

## 2017-09-26 ENCOUNTER — Telehealth: Payer: Self-pay

## 2017-09-26 NOTE — Telephone Encounter (Signed)
  Follow up Call-  Call Kerim Statzer number 09/25/2017  Post procedure Call Kassidi Elza phone  # [phone]  Permission to leave phone message Yes  Some recent data might be hidden     Patient questions:  Do you have a fever, pain , or abdominal swelling? No. Pain Score  0 *  Have you tolerated food without any problems? Yes.    Have you been able to return to your normal activities? Yes.    Do you have any questions about your discharge instructions: Diet   No. Medications  No. Follow up visit  No.  Do you have questions or concerns about your Care? No.  Actions: * If pain score is 4 or above: No action needed, pain <4.

## 2017-10-01 ENCOUNTER — Encounter: Payer: Self-pay | Admitting: Gastroenterology

## 2017-10-02 ENCOUNTER — Ambulatory Visit (AMBULATORY_SURGERY_CENTER): Payer: BLUE CROSS/BLUE SHIELD | Admitting: Gastroenterology

## 2017-10-02 ENCOUNTER — Encounter: Payer: Self-pay | Admitting: Gastroenterology

## 2017-10-02 VITALS — BP 120/76 | HR 65 | Temp 98.2°F | Resp 12 | Ht 63.0 in | Wt 134.0 lb

## 2017-10-02 DIAGNOSIS — D128 Benign neoplasm of rectum: Secondary | ICD-10-CM | POA: Diagnosis not present

## 2017-10-02 DIAGNOSIS — D122 Benign neoplasm of ascending colon: Secondary | ICD-10-CM | POA: Diagnosis not present

## 2017-10-02 DIAGNOSIS — Z1211 Encounter for screening for malignant neoplasm of colon: Secondary | ICD-10-CM | POA: Diagnosis not present

## 2017-10-02 DIAGNOSIS — D123 Benign neoplasm of transverse colon: Secondary | ICD-10-CM | POA: Diagnosis not present

## 2017-10-02 DIAGNOSIS — D129 Benign neoplasm of anus and anal canal: Secondary | ICD-10-CM

## 2017-10-02 MED ORDER — SODIUM CHLORIDE 0.9 % IV SOLN: 500.0000 mL | Freq: Once | INTRAVENOUS | Status: AC

## 2017-10-02 NOTE — Op Note (Signed)
[facility] Patient Name: Ashley Williams Procedure Date: 10/02/2017 10:47 AM MRN: [id number] Endoscopist: Mauri Pole , MD Age: 62 Referring MD:  Date of Birth: [birth year] Gender: Female Account #: [id number] Procedure:                Colonoscopy Indications:              Screening for colorectal malignant neoplasm Medicines:                Monitored Anesthesia Care Procedure:                Pre-Anesthesia Assessment:                           - Prior to the procedure, a History and Physical                            was performed, and patient medications and                            allergies were reviewed. The patient's tolerance of                            previous anesthesia was also reviewed. The risks                            and benefits of the procedure and the sedation                            options and risks were discussed with the patient.                            All questions were answered, and informed consent                            was obtained. Prior Anticoagulants: The patient has                            taken no previous anticoagulant or antiplatelet                            agents. ASA Grade Assessment: II - A patient with                            mild systemic disease. After reviewing the risks                            and benefits, the patient was deemed in                            satisfactory condition to undergo the procedure.                           After obtaining informed consent, the colonoscope  was passed under direct vision. Throughout the                            procedure, the patient's blood pressure, pulse, and                            oxygen saturations were monitored continuously. The                            Model PCF-H190DL [id number]) scope was introduced                            through the anus and advanced to the the cecum,                            identified by  appendiceal orifice and ileocecal                            valve. The colonoscopy was somewhat difficult due                            to inadequate bowel prep and a redundant colon.                            Successful completion of the procedure was aided by                            applying abdominal pressure and lavage. The patient                            tolerated the procedure well. The quality of the                            bowel preparation was adequate to identify polyps 6                            mm and larger in size after extensive lavage and                            suction. The ileocecal valve, appendiceal orifice,                            and rectum were photographed. Scope In: 10:53:05 AM Scope Out: 11:39:22 AM Scope Withdrawal Time: 0 hours 37 minutes 44 seconds  Total Procedure Duration: 0 hours 46 minutes 17 seconds  Findings:                 The perianal and digital rectal examinations were                            normal.                           Three sessile polyps were found in the transverse  colon and ascending colon. The polyps were 11 to 15                            mm in size. These polyps were removed with a                            piecemeal technique using a cold snare. Resection                            and retrieval were complete.                           A 2 mm polyp was found in the transverse colon. The                            polyp was sessile. The polyp was removed with a                            cold biopsy forceps. Resection and retrieval were                            complete.                           A 4 mm polyp was found in the rectum. The polyp was                            sessile. The polyp was removed with a cold snare.                            Resection was complete, but the polyp tissue was                            not retrieved as scope chanel clogged due to poor                             prep.                           Scattered small-mouthed diverticula were found in                            the sigmoid colon.                           Non-bleeding internal hemorrhoids were found during                            retroflexion. The hemorrhoids were small. Complications:            No immediate complications. Estimated Blood Loss:     Estimated blood loss was minimal. Impression:               - Three 11 to 15 mm polyps in the transverse colon  and in the ascending colon, removed piecemeal using                            a cold snare. Resected and retrieved.                           - One 2 mm polyp in the transverse colon, removed                            with a cold biopsy forceps. Resected and retrieved.                           - One 4 mm polyp in the rectum, removed with a cold                            snare. Complete resection. Polyp tissue not                            retrieved.                           - Diverticulosis in the sigmoid colon.                           - Non-bleeding internal hemorrhoids. Recommendation:           - Patient has a contact number available for                            emergencies. The signs and symptoms of potential                            delayed complications were discussed with the                            patient. Return to normal activities tomorrow.                            Written discharge instructions were provided to the                            patient.                           - Resume previous diet.                           - Continue present medications.                           - Await pathology results.                           - No aspirin, ibuprofen, naproxen, or other  non-steroidal anti-inflammatory drugs for 2 weeks.                           - Repeat colonoscopy in 1 year for surveillance                             after piecemeal polypectomy.                           - For future colonoscopy the patient will require                            an extended preparation. If there are any                            questions, please contact the gastroenterologist. Mauri Pole, MD 10/02/2017 11:47:45 AM This report has been signed electronically.

## 2017-10-02 NOTE — Progress Notes (Signed)
Report to PACU, RN, vss, BBS= Clear.  

## 2017-10-02 NOTE — Patient Instructions (Signed)
Discharge instructions given. Handouts on polyps,diverticulosis and hemorrhoids. No aspirin,ibuprofen,naproxen, or other non-steroidal anti-inflammatory drugs for 2 weeks. Resume previous medications. YOU HAD AN ENDOSCOPIC PROCEDURE TODAY AT [facility]:   Refer to the procedure report that was given to you for any specific questions about what was found during the examination.  If the procedure report does not answer your questions, please call your gastroenterologist to clarify.  If you requested that your care partner not be given the details of your procedure findings, then the procedure report has been included in a sealed envelope for you to review at your convenience later.  YOU SHOULD EXPECT: Some feelings of bloating in the abdomen. Passage of more gas than usual.  Walking can help get rid of the air that was put into your GI tract during the procedure and reduce the bloating. If you had a lower endoscopy (such as a colonoscopy or flexible sigmoidoscopy) you may notice spotting of blood in your stool or on the toilet paper. If you underwent a bowel prep for your procedure, you may not have a normal bowel movement for a few days.  Please Note:  You might notice some irritation and congestion in your nose or some drainage.  This is from the oxygen used during your procedure.  There is no need for concern and it should clear up in a day or so.  SYMPTOMS TO REPORT IMMEDIATELY:   Following lower endoscopy (colonoscopy or flexible sigmoidoscopy):  Excessive amounts of blood in the stool  Significant tenderness or worsening of abdominal pains  Swelling of the abdomen that is new, acute  Fever of 100F or higher   For urgent or emergent issues, a gastroenterologist can be reached at any hour by calling [phone].   DIET:  We do recommend a small meal at first, but then you may proceed to your regular diet.  Drink plenty of fluids but you should avoid alcoholic beverages  for 24 hours.  ACTIVITY:  You should plan to take it easy for the rest of today and you should NOT DRIVE or use heavy machinery until tomorrow (because of the sedation medicines used during the test).    FOLLOW UP: Our staff will call the number listed on your records the next business day following your procedure to check on you and address any questions or concerns that you may have regarding the information given to you following your procedure. If we do not reach you, we will leave a message.  However, if you are feeling well and you are not experiencing any problems, there is no need to return our call.  We will assume that you have returned to your regular daily activities without incident.  If any biopsies were taken you will be contacted by phone or by letter within the next 1-3 weeks.  Please call us at [phone] if you have not heard about the biopsies in 3 weeks.    SIGNATURES/CONFIDENTIALITY: You and/or your care partner have signed paperwork which will be entered into your electronic medical record.  These signatures attest to the fact that that the information above on your After Visit Summary has been reviewed and is understood.  Full responsibility of the confidentiality of this discharge information lies with you and/or your care-partner.

## 2017-10-02 NOTE — Progress Notes (Signed)
Called to room to assist during endoscopic procedure.  Patient ID and intended procedure confirmed with present staff. Received instructions for my participation in the procedure from the performing physician.  

## 2017-10-07 ENCOUNTER — Telehealth: Payer: Self-pay

## 2017-10-07 NOTE — Telephone Encounter (Signed)
  Follow up Call-  Call back number 10/02/2017 09/25/2017  Post procedure Call Back phone  # [phone]  Permission to leave phone message Yes Yes  Some recent data might be hidden     Patient questions:  Do you have a fever, pain , or abdominal swelling? No. Pain Score  0 *  Have you tolerated food without any problems? Yes.    Have you been able to return to your normal activities? Yes.    Do you have any questions about your discharge instructions: Diet   No. Medications  No. Follow up visit  No.  Do you have questions or concerns about your Care? No.  Actions: * If pain score is 4 or above: No action needed, pain <4.

## 2017-10-09 ENCOUNTER — Encounter: Payer: Self-pay | Admitting: Gastroenterology

## 2017-10-30 ENCOUNTER — Encounter: Payer: BLUE CROSS/BLUE SHIELD | Admitting: Family Medicine

## 2017-11-06 ENCOUNTER — Encounter: Payer: Self-pay | Admitting: Family Medicine

## 2017-11-06 ENCOUNTER — Ambulatory Visit (INDEPENDENT_AMBULATORY_CARE_PROVIDER_SITE_OTHER): Payer: BLUE CROSS/BLUE SHIELD | Admitting: Family Medicine

## 2017-11-06 VITALS — BP 100/80 | HR 60 | Temp 98.4°F | Ht 63.0 in | Wt 133.9 lb

## 2017-11-06 DIAGNOSIS — Z0000 Encounter for general adult medical examination without abnormal findings: Secondary | ICD-10-CM | POA: Diagnosis not present

## 2017-11-06 DIAGNOSIS — Z23 Encounter for immunization: Secondary | ICD-10-CM

## 2017-11-06 NOTE — Patient Instructions (Signed)
BEFORE YOU LEAVE: -pap report? -flu shot and tetanus booster -follow up: yearly for physical and as needed  Please let us know if you find a copy of the pap report.   Preventive Care 40-64 Years, Female Preventive care refers to lifestyle choices and visits with your health care provider that can promote health and wellness. What does preventive care include?  A yearly physical exam. This is also called an annual well check.  Dental exams once or twice a year.  Routine eye exams. Ask your health care provider how often you should have your eyes checked.  Personal lifestyle choices, including: ? Daily care of your teeth and gums. ? Regular physical activity. ? Eating a healthy diet. ? Avoiding tobacco and drug use. ? Limiting alcohol use. ? Practicing safe sex. ? Taking vitamin and mineral supplements as recommended by your health care provider. What happens during an annual well check? The services and screenings done by your health care provider during your annual well check will depend on your age, overall health, lifestyle risk factors, and family history of disease. Counseling Your health care provider may ask you questions about your:  Alcohol use.  Tobacco use.  Drug use.  Emotional well-being.  Home and relationship well-being.  Sexual activity.  Eating habits.  Work and work Statistician.  Method of birth control.  Menstrual cycle.  Pregnancy history.  Screening You may have the following tests or measurements:  Height, weight, and BMI.  Blood pressure.  Lipid and cholesterol levels. These may be checked every 5 years, or more frequently if you are over 37 years old.  Skin check.  Lung cancer screening. You may have this screening every year starting at age 57 if you have a 30-pack-year history of smoking and currently smoke or have quit within the past 15 years.  Fecal occult blood test (FOBT) of the stool. You may have this test every year  starting at age 11.  Flexible sigmoidoscopy or colonoscopy. You may have a sigmoidoscopy every 5 years or a colonoscopy every 10 years starting at age 52.  Hepatitis C blood test.  Hepatitis B blood test.  Sexually transmitted disease (STD) testing.  Diabetes screening. This is done by checking your blood sugar (glucose) after you have not eaten for a while (fasting). You may have this done every 1-3 years.  Mammogram. This may be done every 1-2 years. Talk to your health care provider about when you should start having regular mammograms. This may depend on whether you have a family history of breast cancer.  BRCA-related cancer screening. This may be done if you have a family history of breast, ovarian, tubal, or peritoneal cancers.  Pelvic exam and Pap test. This may be done every 3 years starting at age 12. Starting at age 70, this may be done every 5 years if you have a Pap test in combination with an HPV test.  Bone density scan. This is done to screen for osteoporosis. You may have this scan if you are at high risk for osteoporosis.  Discuss your test results, treatment options, and if necessary, the need for more tests with your health care provider. Vaccines Your health care provider may recommend certain vaccines, such as:  Influenza vaccine. This is recommended every year.  Tetanus, diphtheria, and acellular pertussis (Tdap, Td) vaccine. You may need a Td booster every 10 years.  Varicella vaccine. You may need this if you have not been vaccinated.  Zoster vaccine. You may need  this after age 57.  Measles, mumps, and rubella (MMR) vaccine. You may need at least one dose of MMR if you were born in 1957 or later. You may also need a second dose.  Pneumococcal 13-valent conjugate (PCV13) vaccine. You may need this if you have certain conditions and were not previously vaccinated.  Pneumococcal polysaccharide (PPSV23) vaccine. You may need one or two doses if you smoke  cigarettes or if you have certain conditions.  Meningococcal vaccine. You may need this if you have certain conditions.  Hepatitis A vaccine. You may need this if you have certain conditions or if you travel or work in places where you may be exposed to hepatitis A.  Hepatitis B vaccine. You may need this if you have certain conditions or if you travel or work in places where you may be exposed to hepatitis B.  Haemophilus influenzae type b (Hib) vaccine. You may need this if you have certain conditions.  Talk to your health care provider about which screenings and vaccines you need and how often you need them. This information is not intended to replace advice given to you by your health care provider. Make sure you discuss any questions you have with your health care provider. Document Released: 02/17/2015 Document Revised: 10/11/2015 Document Reviewed: 11/22/2014 Elsevier Interactive Patient Education  [employer].

## 2017-11-06 NOTE — Progress Notes (Signed)
HPI:  Using dictation device. Unfortunately this device frequently misinterprets words/phrases.  Here for CPE:  -Concerns and/or follow up today: none Due for flu shot, tetanus booster, mammo in november.  Chronic medical problems summarized below were reviewed for changes. Seeing dermatologist.  S/p hip replacement.  -Diet: variety of foods, balance and well rounded, larger portion sizes -Exercise: no regular exercise -Taking folic acid, vitamin D or calcium: no -Diabetes and Dyslipidemia Screening: declined -Vaccines: see vaccine section EPIC -pap history: utd per epic, reports all normal in the past -FDLMP: see nursing notes -sexual activity: yes, female partner, no new partners -wants STI testing (Hep C if born 80-65): no -FH breast, colon or ovarian ca: see FH Last mammogram: 12/2016 birads 1 Last colon cancer screening: 09/2017 Breast Ca Risk Assessment: see family history and pt history DEXA (>/= 65): n/a  -Alcohol, Tobacco, drug use: see social history  Review of Systems - no fevers, unintentional weight loss, vision loss, hearing loss, chest pain, sob, hemoptysis, melena, hematochezia, hematuria, genital discharge, changing or concerning skin lesions, abd pain, pelvic pain or bloating, bleeding, bruising, loc, thoughts of self harm or SI  Past Medical History:  Diagnosis Date  . Arthritis   . Complication of anesthesia    1979- blood pressure drop during surgery   . Eczema    on face due to allergies  . PTSD (post-traumatic stress disorder)     Past Surgical History:  Procedure Laterality Date  . COLONOSCOPY    . CYST EXCISION  2004   groin area  . PILONIDAL CYST EXCISION  1979  . TOTAL HIP ARTHROPLASTY Right 03/11/2017   Procedure: RIGHT TOTAL HIP ARTHROPLASTY ANTERIOR APPROACH;  Surgeon: Paralee Cancel, MD;  Location: [facility];  Service: Orthopedics;  Laterality: Right;  70 mins    Family History  Problem Relation Age of Onset  . Cancer Mother   . Uterine  cancer Mother   . Ovarian cancer Mother   . Cancer Father   . Cancer Maternal Grandmother   . Colon cancer Maternal Grandmother   . Emphysema Maternal Grandfather   . Cancer Paternal Grandfather     Social History   Socioeconomic History  . Marital status: Married    Spouse name: Not on file  . Number of children: Not on file  . Years of education: Not on file  . Highest education level: Not on file  Occupational History  . Not on file  Social Needs  . Financial resource strain: Not on file  . Food insecurity:    Worry: Not on file    Inability: Not on file  . Transportation needs:    Medical: Not on file    Non-medical: Not on file  Tobacco Use  . Smoking status: Former Smoker    Last attempt to quit: 09/11/1987    Years since quitting: 30.1  . Smokeless tobacco: Never Used  Substance and Sexual Activity  . Alcohol use: Not Currently  . Drug use: No  . Sexual activity: Yes    Birth control/protection: Post-menopausal  Lifestyle  . Physical activity:    Days per week: Not on file    Minutes per session: Not on file  . Stress: Not on file  Relationships  . Social connections:    Talks on phone: Not on file    Gets together: Not on file    Attends religious service: Not on file    Active member of club or organization: Not on file  Attends meetings of clubs or organizations: Not on file    Relationship status: Not on file  Other Topics Concern  . Not on file  Social History Narrative   Work or Geographical information systems officer, does Engineer, manufacturing counseling, private practice      Home Situation:lives with husband      Spiritual Beliefs:Christian      Lifestyle:yoga and weight lifting on a regular basis, healthy diet     Current Outpatient Medications:  .  Flaxseed, Linseed, (FLAXSEED OIL) 1200 MG CAPS, Take 1,200 mg by mouth daily., Disp: , Rfl:  .  Glucosamine-Chondroitin (OSTEO BI-FLEX REGULAR STRENGTH PO), Take 1 tablet by mouth daily. , Disp: , Rfl:  .  MELATON-5  HTP-TRYPTOPHAN-B6-MG PO, Take 1 tablet by mouth every other day. Essential Sleep Dietary Supplement at bedtime., Disp: , Rfl:  .  Multiple Vitamin (MULTIVITAMIN WITH MINERALS) TABS tablet, Take 1 tablet by mouth daily. DAILY ESSENTIALS, Disp: , Rfl:  .  Omega-3 Fatty Acids (FISH OIL) 1200 MG CAPS, Take 1,200 mg by mouth daily., Disp: , Rfl:  .  OVER THE COUNTER MEDICATION, Take 2 capsules by mouth 2 (two) times daily. MOOD ESSENTIALS., Disp: , Rfl:  .  TURMERIC PO, Take by mouth 3 (three) times daily. Take 1 tablets tid, Disp: , Rfl:   Current Facility-Administered Medications:  .  0.9 %  sodium chloride infusion, 500 mL, Intravenous, Once, Nandigam, Kavitha V, MD  EXAM:  Vitals:   11/06/17 1332  BP: 100/80  Pulse: 60  Temp: 98.4 F (36.9 C)    GENERAL: vitals reviewed and listed below, alert, oriented, appears well hydrated and in no acute distress  HEENT: head atraumatic, PERRLA, normal appearance of eyes, ears, nose and mouth. moist mucus membranes.  NECK: supple, no masses or lymphadenopathy  LUNGS: clear to auscultation bilaterally, no rales, rhonchi or wheeze  CV: HRRR, no peripheral edema or cyanosis, normal pedal pulses  ABDOMEN: bowel sounds normal, soft, non tender to palpation, no masses, no rebound or guarding  GU/BREAST: declined  SKIN: no rash or abnormal lesions  MS: normal gait, moves all extremities normally  NEURO: normal gait, speech and thought processing grossly intact, muscle tone grossly intact throughout  PSYCH: normal affect, pleasant and cooperative  ASSESSMENT AND PLAN:  Discussed the following assessment and plan:  PREVENTIVE EXAM: -Discussed and advised all [place] preventive services health task force level A and B recommendations for age, sex and risks. -Advised at least 150 minutes of exercise per week and a healthy diet with avoidance of (less then 1 serving per week) processed foods, white starches, red meat, fast foods and sweets and  consisting of: * 5-9 servings of fresh fruits and vegetables (not corn or potatoes) *nuts and seeds, beans *olives and olive oil *lean meats such as fish and white chicken  *whole grains -labs, studies and vaccines per orders this encounter -flu and tetanus booster today -she may look into ovarian cancer screening more thru the cancer center as mother had ovarian cancer  Patient advised to return to clinic immediately if symptoms worsen or persist or new concerns.  Patient Instructions  BEFORE YOU LEAVE: -pap report? -flu shot and tetanus booster -follow up: yearly for physical and as needed  Please let us know if you find a copy of the pap report.   Preventive Care 40-64 Years, Female Preventive care refers to lifestyle choices and visits with your health care provider that can promote health and wellness. What does preventive care include?  A yearly  physical exam. This is also called an annual well check.  Dental exams once or twice a year.  Routine eye exams. Ask your health care provider how often you should have your eyes checked.  Personal lifestyle choices, including: ? Daily care of your teeth and gums. ? Regular physical activity. ? Eating a healthy diet. ? Avoiding tobacco and drug use. ? Limiting alcohol use. ? Practicing safe sex. ? Taking vitamin and mineral supplements as recommended by your health care provider. What happens during an annual well check? The services and screenings done by your health care provider during your annual well check will depend on your age, overall health, lifestyle risk factors, and family history of disease. Counseling Your health care provider may ask you questions about your:  Alcohol use.  Tobacco use.  Drug use.  Emotional well-being.  Home and relationship well-being.  Sexual activity.  Eating habits.  Work and work Statistician.  Method of birth control.  Menstrual cycle.  Pregnancy  history.  Screening You may have the following tests or measurements:  Height, weight, and BMI.  Blood pressure.  Lipid and cholesterol levels. These may be checked every 5 years, or more frequently if you are over 30 years old.  Skin check.  Lung cancer screening. You may have this screening every year starting at age 13 if you have a 30-pack-year history of smoking and currently smoke or have quit within the past 15 years.  Fecal occult blood test (FOBT) of the stool. You may have this test every year starting at age 85.  Flexible sigmoidoscopy or colonoscopy. You may have a sigmoidoscopy every 5 years or a colonoscopy every 10 years starting at age 38.  Hepatitis C blood test.  Hepatitis B blood test.  Sexually transmitted disease (STD) testing.  Diabetes screening. This is done by checking your blood sugar (glucose) after you have not eaten for a while (fasting). You may have this done every 1-3 years.  Mammogram. This may be done every 1-2 years. Talk to your health care provider about when you should start having regular mammograms. This may depend on whether you have a family history of breast cancer.  BRCA-related cancer screening. This may be done if you have a family history of breast, ovarian, tubal, or peritoneal cancers.  Pelvic exam and Pap test. This may be done every 3 years starting at age 3. Starting at age 26, this may be done every 5 years if you have a Pap test in combination with an HPV test.  Bone density scan. This is done to screen for osteoporosis. You may have this scan if you are at high risk for osteoporosis.  Discuss your test results, treatment options, and if necessary, the need for more tests with your health care provider. Vaccines Your health care provider may recommend certain vaccines, such as:  Influenza vaccine. This is recommended every year.  Tetanus, diphtheria, and acellular pertussis (Tdap, Td) vaccine. You may need a Td booster  every 10 years.  Varicella vaccine. You may need this if you have not been vaccinated.  Zoster vaccine. You may need this after age 45.  Measles, mumps, and rubella (MMR) vaccine. You may need at least one dose of MMR if you were born in 1957 or later. You may also need a second dose.  Pneumococcal 13-valent conjugate (PCV13) vaccine. You may need this if you have certain conditions and were not previously vaccinated.  Pneumococcal polysaccharide (PPSV23) vaccine. You may need one or  two doses if you smoke cigarettes or if you have certain conditions.  Meningococcal vaccine. You may need this if you have certain conditions.  Hepatitis A vaccine. You may need this if you have certain conditions or if you travel or work in places where you may be exposed to hepatitis A.  Hepatitis B vaccine. You may need this if you have certain conditions or if you travel or work in places where you may be exposed to hepatitis B.  Haemophilus influenzae type b (Hib) vaccine. You may need this if you have certain conditions.  Talk to your health care provider about which screenings and vaccines you need and how often you need them. This information is not intended to replace advice given to you by your health care provider. Make sure you discuss any questions you have with your health care provider. Document Released: 02/17/2015 Document Revised: 10/11/2015 Document Reviewed: 11/22/2014 Elsevier Interactive Patient Education  2018 [employer].    No follow-ups on file.  Lucretia Kern, DO

## 2017-11-06 NOTE — Addendum Note (Signed)
Addended by: Agnes Lawrence on: 11/06/2017 02:19 PM   Modules accepted: Orders

## 2017-12-05 ENCOUNTER — Other Ambulatory Visit: Payer: Self-pay | Admitting: Family Medicine

## 2017-12-05 DIAGNOSIS — Z1231 Encounter for screening mammogram for malignant neoplasm of breast: Secondary | ICD-10-CM

## 2017-12-17 ENCOUNTER — Ambulatory Visit: Payer: BLUE CROSS/BLUE SHIELD

## 2018-01-05 DIAGNOSIS — Z1231 Encounter for screening mammogram for malignant neoplasm of breast: Secondary | ICD-10-CM | POA: Diagnosis not present

## 2018-09-07 ENCOUNTER — Encounter: Payer: Self-pay | Admitting: Gastroenterology

## 2019-10-31 IMAGING — MG DIGITAL SCREENING BILATERAL MAMMOGRAM WITH CAD
4 series · 4 of 4 positions shown · non-contrast
Comparison: None.

CLINICAL DATA: Screening.

EXAM:
DIGITAL SCREENING BILATERAL MAMMOGRAM WITH CAD

[R CC]
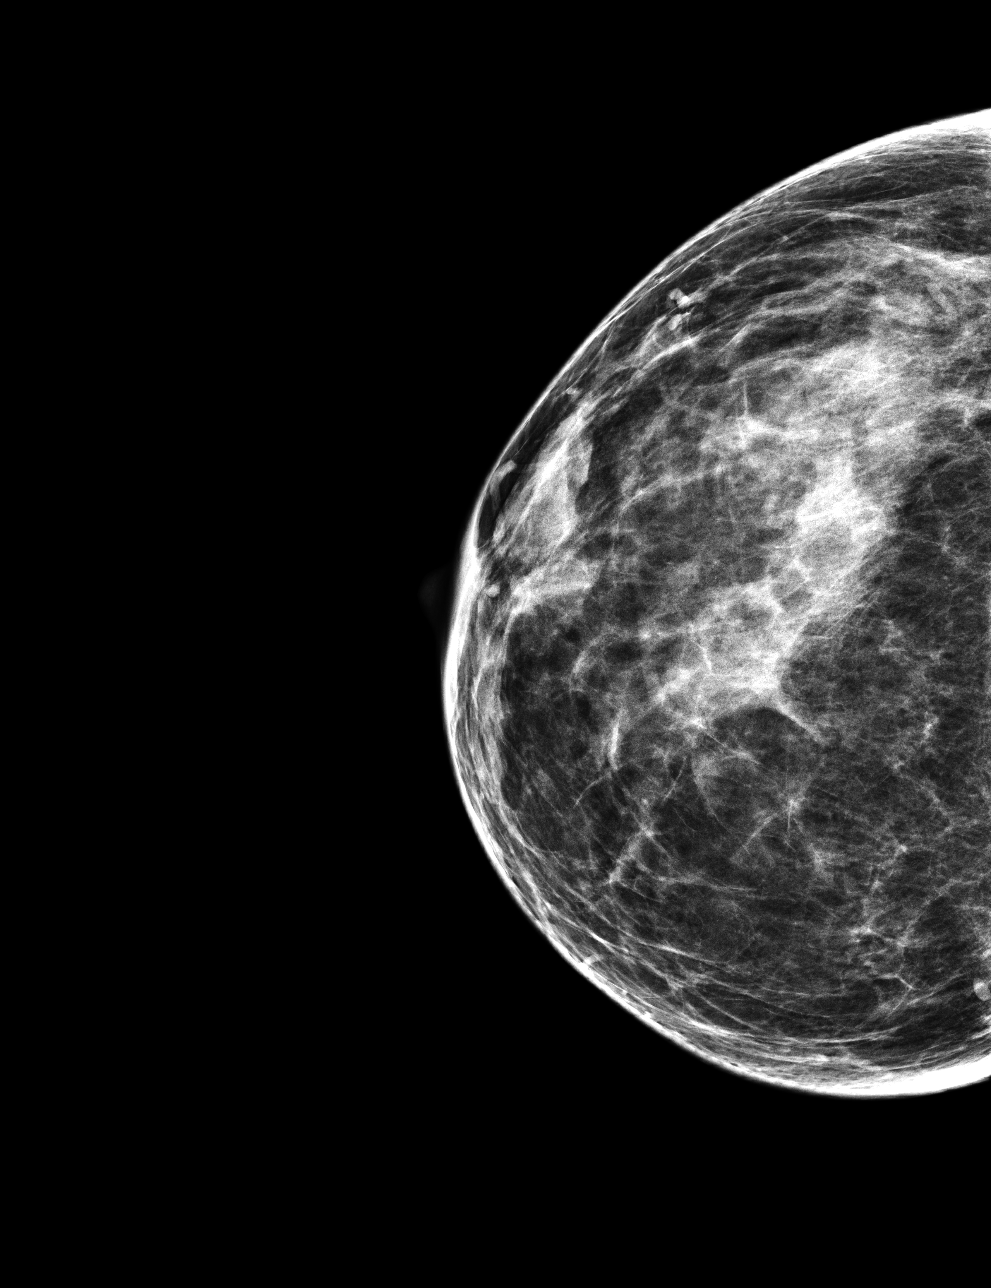

[R MLO]
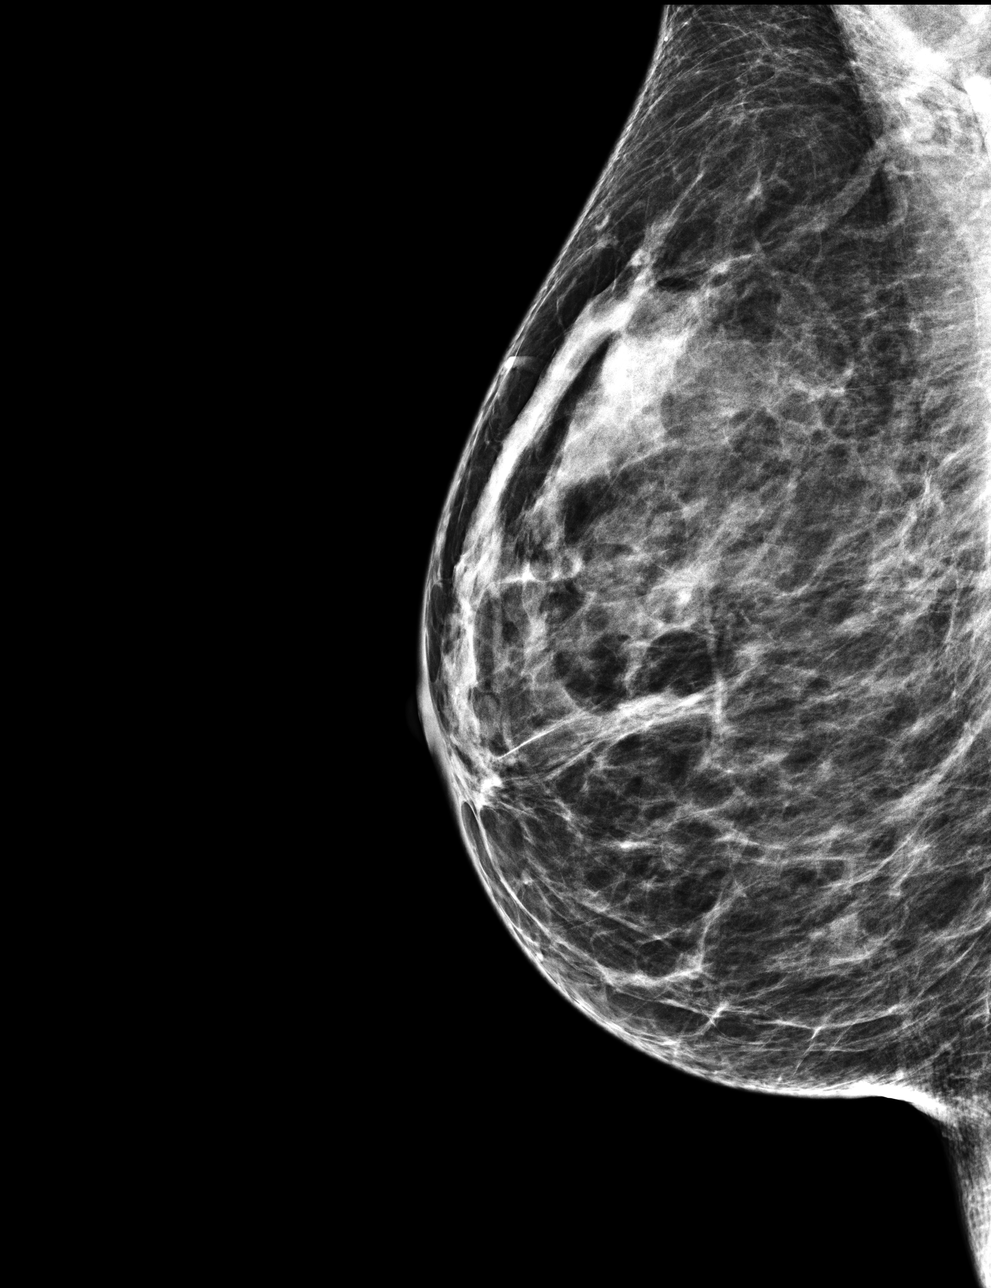

[L CC]
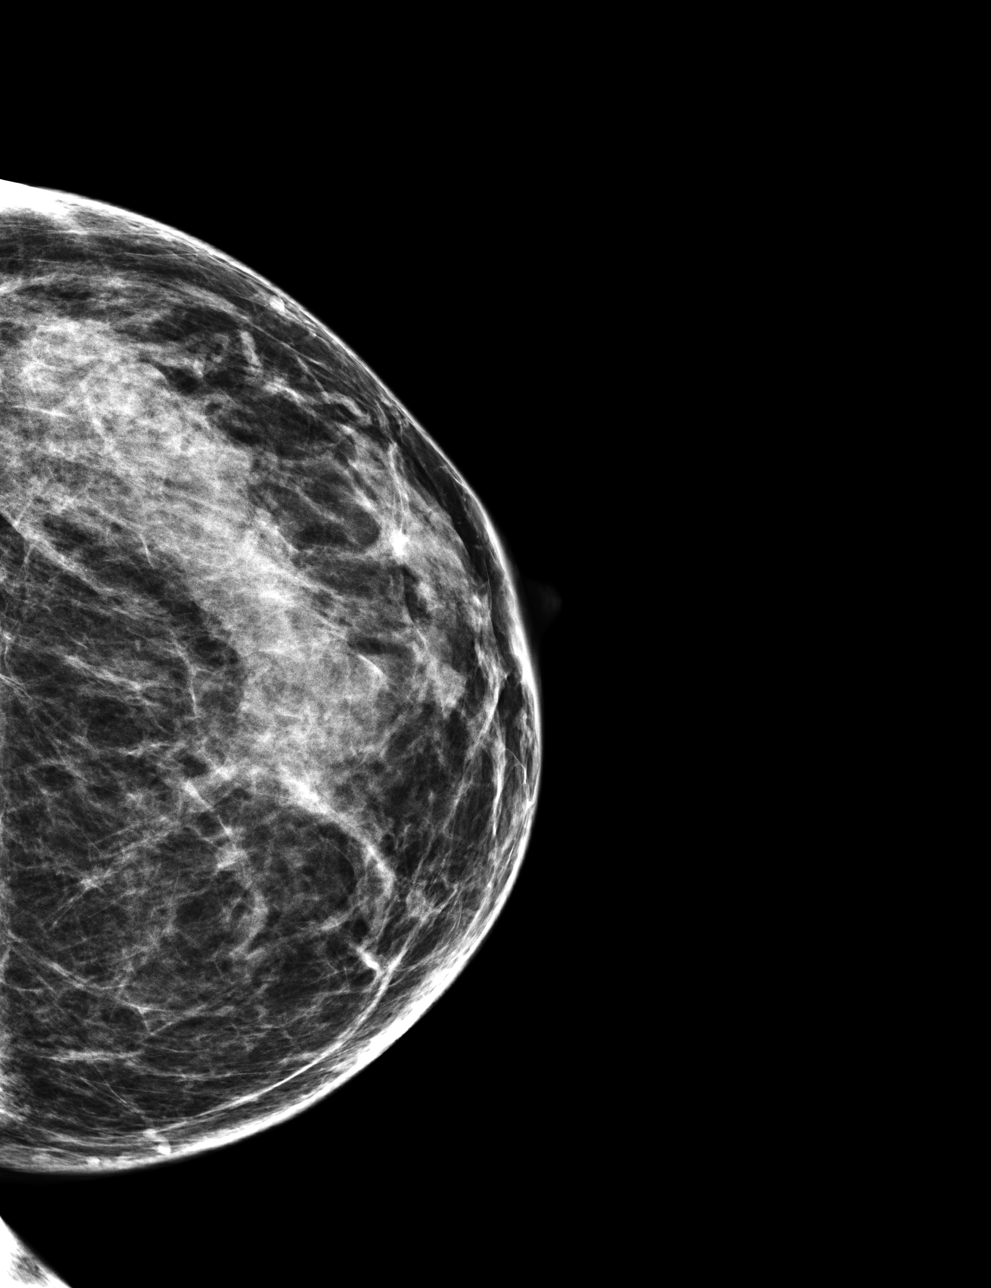

[L MLO]
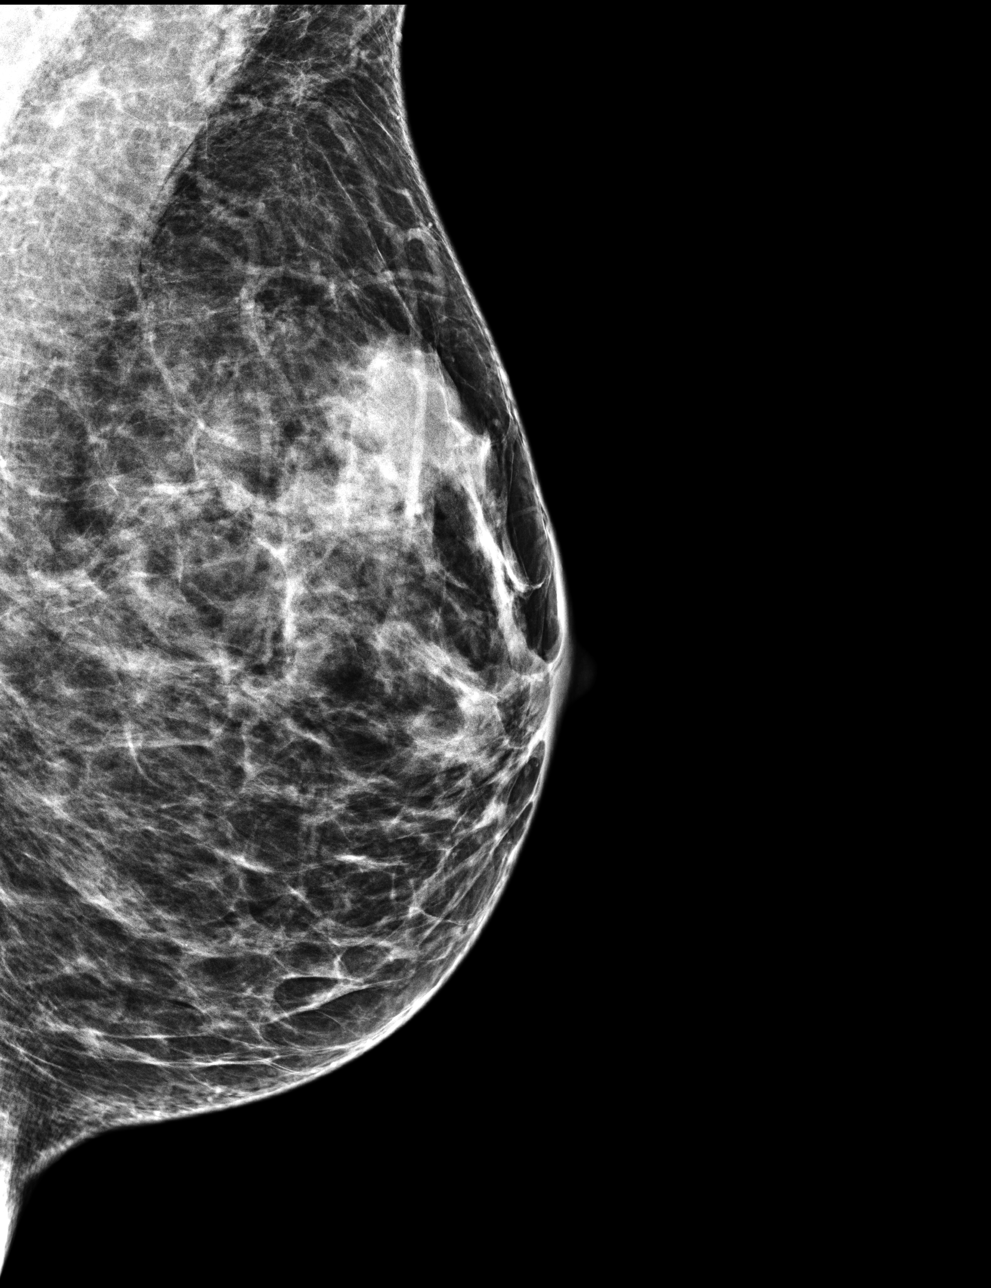

[4 of 4 positions shown; findings below may reference images not displayed]

ACR Breast Density Category c: The breast tissue is heterogeneously
dense, which may obscure small masses
FINDINGS: There are no findings suspicious for malignancy. Images were
processed with CAD.
IMPRESSION: No mammographic evidence of malignancy. A result letter of this
screening mammogram will be mailed directly to the patient.

RECOMMENDATION:
Screening mammogram in one year. (Code:[id number])

BI-RADS CATEGORY  1: Negative.

## 2023-09-04 ENCOUNTER — Other Ambulatory Visit: Payer: Self-pay

## 2023-09-04 ENCOUNTER — Emergency Department (HOSPITAL_BASED_OUTPATIENT_CLINIC_OR_DEPARTMENT_OTHER)

## 2023-09-04 ENCOUNTER — Encounter (HOSPITAL_BASED_OUTPATIENT_CLINIC_OR_DEPARTMENT_OTHER): Payer: Self-pay | Admitting: Emergency Medicine

## 2023-09-04 ENCOUNTER — Emergency Department (HOSPITAL_BASED_OUTPATIENT_CLINIC_OR_DEPARTMENT_OTHER)
Admission: EM | Admit: 2023-09-04 | Discharge: 2023-09-04 | Disposition: A | Discharge status: Home / Self Care | Attending: Emergency Medicine | Admitting: Emergency Medicine

## 2023-09-04 DIAGNOSIS — R197 Diarrhea, unspecified: Secondary | ICD-10-CM | POA: Insufficient documentation

## 2023-09-04 DIAGNOSIS — R5383 Other fatigue: Secondary | ICD-10-CM | POA: Diagnosis not present

## 2023-09-04 DIAGNOSIS — R109 Unspecified abdominal pain: Secondary | ICD-10-CM | POA: Diagnosis not present

## 2023-09-04 LAB — COMPREHENSIVE METABOLIC PANEL WITH GFR
ALT: 15 U/L (ref 0–44)
AST: 25 U/L (ref 15–41)
Albumin: 4.3 g/dL (ref 3.5–5.0)
Alkaline Phosphatase: 64 U/L (ref 38–126)
Anion gap: 10 (ref 5–15)
BUN: 10 mg/dL (ref 8–23)
CO2: 26 mmol/L (ref 22–32)
Calcium: 9.3 mg/dL (ref 8.9–10.3)
Chloride: 99 mmol/L (ref 98–111)
Creatinine, Ser: 0.87 mg/dL (ref 0.44–1.00)
GFR, Estimated: >60 mL/min (ref >60)
Glucose, Bld: 88 mg/dL (ref 70–99)
Potassium: 4.2 mmol/L (ref 3.5–5.1)
Sodium: 135 mmol/L (ref 135–145)
Total Bilirubin: 0.4 mg/dL (ref 0.0–1.2)
Total Protein: 6.8 g/dL (ref 6.5–8.1)

## 2023-09-04 LAB — CBC WITH DIFFERENTIAL/PLATELET
Abs Immature Granulocytes: 0.01 K/uL (ref 0.00–0.07)
Basophils Absolute: 0 K/uL (ref 0.0–0.1)
Basophils Relative: 1 %
Eosinophils Absolute: 0.1 K/uL (ref 0.0–0.5)
Eosinophils Relative: 2 %
HCT: 35.3 % — ABNORMAL LOW (ref 36.0–46.0)
Hemoglobin: 12 g/dL (ref 12.0–15.0)
Immature Granulocytes: 0 %
Lymphocytes Relative: 20 %
Lymphs Abs: 1 K/uL (ref 0.7–4.0)
MCH: 33.2 pg (ref 26.0–34.0)
MCHC: 34 g/dL (ref 30.0–36.0)
MCV: 97.8 fL (ref 80.0–100.0)
Monocytes Absolute: 0.5 K/uL (ref 0.1–1.0)
Monocytes Relative: 9 %
Neutro Abs: 3.3 K/uL (ref 1.7–7.7)
Neutrophils Relative %: 68 %
Platelets: 326 K/uL (ref 150–400)
RBC: 3.61 MIL/uL — ABNORMAL LOW (ref 3.87–5.11)
RDW: 12.8 % (ref 11.5–15.5)
WBC: 4.9 K/uL (ref 4.0–10.5)
nRBC: 0 % (ref 0.0–0.2)

## 2023-09-04 LAB — MAGNESIUM: Magnesium: 2.5 mg/dL — ABNORMAL HIGH (ref 1.7–2.4)

## 2023-09-04 MED ORDER — SODIUM CHLORIDE 0.9 % IV BOLUS
1000.0000 mL | Freq: Once | INTRAVENOUS | Status: AC
  Administered 2023-09-04: 1000 mL via INTRAVENOUS

## 2023-09-04 MED ORDER — DIPHENOXYLATE-ATROPINE 2.5-0.025 MG PO TABS: 1.0000 | ORAL_TABLET | Freq: Four times a day (QID) | ORAL | 0 refills | Status: AC | PRN

## 2023-09-04 MED ORDER — IOHEXOL 300 MG/ML  SOLN
95.0000 mL | Freq: Once | INTRAMUSCULAR | Status: AC | PRN
  Administered 2023-09-04: 95 mL via INTRAVENOUS

## 2023-09-04 NOTE — Discharge Instructions (Addendum)
 Please read and follow all provided instructions.  Your diagnoses today include:  1. Diarrhea, unspecified type     Tests performed today include: Complete blood cell count: No concerning findings Complete metabolic panel: No concerning findings, magnesium  is minimally elevated CT scan of the abdomen pelvis shows diarrhea but no signs of inflammation, mass or tumor, other acute problems Vital signs. See below for your results today.   Medications prescribed:  None  Take any prescribed medications only as directed.  Home care instructions:  Follow any educational materials contained in this packet.  Follow-up instructions: Please follow-up with your primary care provider and gastroenterologist as planned.   Return instructions:  SEEK IMMEDIATE MEDICAL ATTENTION IF: The pain does not go away or becomes severe  A temperature above 101F develops  Repeated vomiting occurs (multiple episodes)  The pain becomes localized to portions of the abdomen. The right side could possibly be appendicitis. In an adult, the left lower portion of the abdomen could be colitis or diverticulitis.  Blood is being passed in stools or vomit (bright red or black tarry stools)  You develop chest pain, difficulty breathing, dizziness or fainting, or become confused, poorly responsive, or inconsolable (young children) If you have any other emergent concerns regarding your health  Additional Information: Abdominal (belly) pain can be caused by many things. Your caregiver performed an examination and possibly ordered blood/urine tests and imaging (CT scan, x-rays, ultrasound). Many cases can be observed and treated at home after initial evaluation in the emergency department. Even though you are being discharged home, abdominal pain can be unpredictable. Therefore, you need a repeated exam if your pain does not resolve, returns, or worsens. Most patients with abdominal pain don't have to be admitted to the hospital  or have surgery, but serious problems like appendicitis and gallbladder attacks can start out as nonspecific pain. Many abdominal conditions cannot be diagnosed in one visit, so follow-up evaluations are very important.  Your vital signs today were: BP 127/74 (BP Location: Right Arm)   Pulse (!) 49   Temp 98 F (36.7 C) (Oral)   Resp 14   Ht 5' 3 (1.6 m)   Wt 57.6 kg   SpO2 96%   BMI 22.50 kg/m  If your blood pressure (bp) was elevated above 135/85 this visit, please have this repeated by your doctor within one month. --------------

## 2023-09-04 NOTE — ED Notes (Signed)
 IV Removed

## 2023-09-04 NOTE — ED Provider Notes (Signed)
 [place] [facility] AT [facility] Provider Note   CSN: [id number] Arrival date & time: 09/04/23  9047     Patient presents with: Diarrhea   Ashley Williams is a 68 y.o. female.   Patient with history of colonoscopy with polyps --presents the emergency department today for evaluation of ongoing diarrhea.  This has been a problem for the past 4 to 5 weeks.  Started after a trip to [place] .  Patient reports multiple episodes of watery stool.  She will get some reprieve lasting 1-2 days but then symptoms returned.  No significant abdominal pain or bloating with this.  No vomiting or fevers.  No blood noted in the stool.  She did see PCP and had C. difficile testing, GI pathogen panel, and O&P testing.  These returned negative.  Patient has appointment with GI, but not for another 4 weeks.  Patient states that she had some cherry juice yesterday.  She had a long night last night with several episodes of diarrhea.  She feels generally fatigued.  Given that she felt worse, came in for evaluation today.  She denies suspicious food or water  exposures.  No camping.  No previous antibiotic use.  Patient has been taking Lomotil  4 times a day.       Prior to Admission medications   Medication Sig Start Date End Date Taking? Authorizing Provider  Flaxseed, Linseed, (FLAXSEED OIL) 1200 MG CAPS Take 1,200 mg by mouth daily.    [provider]  Glucosamine-Chondroitin (OSTEO BI-FLEX REGULAR STRENGTH PO) Take 1 tablet by mouth daily.     [provider]  MELATON-5 HTP-TRYPTOPHAN-B6-MG PO Take 1 tablet by mouth every other day. Essential Sleep Dietary Supplement at bedtime.    [provider]  Multiple Vitamin (MULTIVITAMIN WITH MINERALS) TABS tablet Take 1 tablet by mouth daily. DAILY ESSENTIALS    [provider]  Omega-3 Fatty Acids (FISH OIL) 1200 MG CAPS Take 1,200 mg by mouth daily.    [provider]  TURMERIC PO Take by mouth 3  (three) times daily. Take 1 tablets tid    [provider]    Allergies: Patient has no known allergies.    Review of Systems  Updated Vital Signs BP 119/86 (BP Location: Right Arm)   Pulse (!) 59   Temp (!) 96.1 F (35.6 C) (Oral)   Resp 18   Ht 5' 3 (1.6 m)   Wt 57.6 kg   SpO2 97%   BMI 22.50 kg/m   Physical Exam Vitals and nursing note reviewed.  Constitutional:      General: She is not in acute distress.    Appearance: She is well-developed.  HENT:     Head: Normocephalic and atraumatic.     Right Ear: External ear normal.     Left Ear: External ear normal.     Nose: Nose normal.  Eyes:     Conjunctiva/sclera: Conjunctivae normal.  Cardiovascular:     Rate and Rhythm: Normal rate and regular rhythm.     Heart sounds: No murmur heard. Pulmonary:     Effort: No respiratory distress.     Breath sounds: No wheezing, rhonchi or rales.  Abdominal:     Palpations: Abdomen is soft.     Tenderness: There is no abdominal tenderness. There is no guarding or rebound.  Musculoskeletal:     Cervical back: Normal range of motion and neck supple.     Right lower leg: No edema.  Left lower leg: No edema.  Skin:    General: Skin is warm and dry.     Findings: No rash.  Neurological:     General: No focal deficit present.     Mental Status: She is alert. Mental status is at baseline.     Motor: No weakness.  Psychiatric:        Mood and Affect: Mood normal.     (all labs ordered are listed, but only abnormal results are displayed) Labs Reviewed - No data to display  EKG: None  Radiology: No results found.   Procedures   Medications Ordered in the ED  sodium chloride  0.9 % bolus 1,000 mL (has no administration in time range)   ED Course  Patient seen and examined. History obtained directly from patient and husband at bedside.  Patient has copies of recent lab work including stool studies which were negative.  Labs/EKG: Ordered CBC, CMP,  magnesium , UA.  Imaging: Ordered CT abdomen pelvis.  Medications/Fluids: Ordered: IV fluids.  Most recent vital signs reviewed and are as follows: BP 119/86 (BP Location: Right Arm)   Pulse (!) 59   Temp (!) 96.1 F (35.6 C) (Oral)   Resp 18   Ht 5' 3 (1.6 m)   Wt 57.6 kg   SpO2 97%   BMI 22.50 kg/m   Initial impression: Diarrhea, ongoing for 1 month.  Patient clinically looks well.  Given duration of symptoms, will check labs and kidney function.  Will give IV fluids.  Given duration of symptoms, will obtain CT imaging to evaluate for possible inflammatory cause such as colitis.  Encourage patient to keep outpatient GI follow-up when able as they would likely order other testing to help identify underlying cause.  1:49 PM Reassessment performed. Patient appears stable.  Labs personally reviewed and interpreted including: CBC shows normal white blood cell count and hemoglobin; CMP with normal electrolytes and kidney function, normal liver function testing; magnesium  minimally elevated, patient does take a supplement at bedtime.  Imaging personally visualized and interpreted including: CT scan of the abdomen pelvis, agree liquid stool noted, no acute findings, signs of colitis, enteritis, mass or tumor.  Reviewed pertinent lab work and imaging with patient at bedside. Questions answered.   Most current vital signs reviewed and are as follows: BP 127/74 (BP Location: Right Arm)   Pulse (!) 49   Temp 98 F (36.7 C) (Oral)   Resp 14   Ht 5' 3 (1.6 m)   Wt 57.6 kg   SpO2 96%   BMI 22.50 kg/m   Plan: Discharge to home.   Prescriptions written for: None  Other home care instructions discussed: Continued maintain good hydration and oral intake  ED return instructions discussed: The patient was urged to return to the Emergency Department immediately with worsening of current symptoms, worsening abdominal pain, persistent vomiting, blood noted in stools, fever, or any other  concerns. The patient verbalized understanding.   Follow-up instructions discussed: Patient encouraged to follow-up with their PCP and GI as planned.                                  Medical Decision Making Amount and/or Complexity of Data Reviewed Labs: ordered. Radiology: ordered.  Risk Prescription drug management.   Patient with diarrhea now for greater than 1 month.  She has had negative outpatient testing as far as GI pathogen panel is concerned.  Negative C.  difficile.  No fevers or other infectious symptoms.  Fortunately workup today shows normal electrolytes and kidney function.  No evidence of severe dehydration.  No electrolyte derangements.  CT scan without signs of colitis or other infection, other acute etiologies or concerns.  Patient will need GI follow-up.  She has an appointment scheduled but not for several weeks.  No indication for emergent consultation today.  The patient's vital signs, pertinent lab work and imaging were reviewed and interpreted as discussed in the ED course. Hospitalization was considered for further testing, treatments, or serial exams/observation. However as patient is well-appearing, has a stable exam, and reassuring studies today, I do not feel that they warrant admission at this time. This plan was discussed with the patient who verbalizes agreement and comfort with this plan and seems reliable and able to return to the Emergency Department with worsening or changing symptoms.       Final diagnoses:  Diarrhea, unspecified type    ED Discharge Orders     None          Desiderio Chew, PA-C 09/04/23 1351    Elnor Bernarda SQUIBB, DO 09/12/23 2335

## 2023-09-04 NOTE — ED Notes (Signed)

## 2023-09-04 NOTE — ED Triage Notes (Signed)
 Diarrhea since June 29.  Liquid stool.  No antibiotic use.  No dizziness, no chest pain, no sob.  Able to eat/drink
